# Patient Record
Sex: Female | Born: 1955 | Race: White | Hispanic: No | Marital: Single | Smoking: Former smoker
Health system: Southern US, Community
[De-identification: ages and names within clinical notes are randomized; demographics above are authoritative.]

## PROBLEM LIST (undated history)

## (undated) DIAGNOSIS — B029 Zoster without complications: Secondary | ICD-10-CM

## (undated) DIAGNOSIS — B009 Herpesviral infection, unspecified: Secondary | ICD-10-CM

## (undated) DIAGNOSIS — T7840XA Allergy, unspecified, initial encounter: Secondary | ICD-10-CM

## (undated) DIAGNOSIS — R011 Cardiac murmur, unspecified: Secondary | ICD-10-CM

## (undated) DIAGNOSIS — B019 Varicella without complication: Secondary | ICD-10-CM

## (undated) HISTORY — DX: Herpesviral infection, unspecified: B00.9

## (undated) HISTORY — DX: Varicella without complication: B01.9

## (undated) HISTORY — PX: WISDOM TOOTH EXTRACTION: SHX21

## (undated) HISTORY — DX: Cardiac murmur, unspecified: R01.1

## (undated) HISTORY — DX: Allergy, unspecified, initial encounter: T78.40XA

## (undated) HISTORY — DX: Zoster without complications: B02.9

---

## 2014-09-20 LAB — LIPID PANEL
Cholesterol: 225 — AB (ref 0–200)
HDL: 52 (ref 35–70)
LDL Cholesterol: 146
Triglycerides: 137 (ref 40–160)

## 2014-09-20 LAB — HEPATIC FUNCTION PANEL
ALT: 15 (ref 7–35)
AST: 14 (ref 13–35)

## 2014-09-20 LAB — CBC AND DIFFERENTIAL
HCT: 41 (ref 36–46)
Hemoglobin: 13.1 (ref 12.0–16.0)
Platelets: 254 (ref 150–399)
WBC: 3.7

## 2014-09-20 LAB — TSH: TSH: 2.22 (ref 0.41–5.90)

## 2014-09-20 LAB — HEMOGLOBIN A1C: Hemoglobin A1C: 5.7

## 2014-09-20 LAB — CBC: RBC: 4.38 (ref 3.87–5.11)

## 2015-11-07 LAB — CBC: RBC: 4.31 (ref 3.87–5.11)

## 2015-11-07 LAB — LIPID PANEL
Cholesterol: 256 — AB (ref 0–200)
HDL: 57 (ref 35–70)
LDL Cholesterol: 179
Triglycerides: 102 (ref 40–160)

## 2015-11-07 LAB — CBC AND DIFFERENTIAL
HCT: 40 (ref 36–46)
Hemoglobin: 13.4 (ref 12.0–16.0)
Platelets: 261 (ref 150–399)
WBC: 3.8

## 2015-11-07 LAB — HEPATIC FUNCTION PANEL
ALT: 15 (ref 7–35)
AST: 16 (ref 13–35)

## 2015-11-07 LAB — BASIC METABOLIC PANEL
BUN: 14 (ref 4–21)
Creatinine: 0.8 (ref 0.5–1.1)

## 2015-11-07 LAB — HEMOGLOBIN A1C: Hemoglobin A1C: 5.6

## 2015-11-07 LAB — TSH: TSH: 2.02 (ref 0.41–5.90)

## 2019-11-08 DIAGNOSIS — I2510 Atherosclerotic heart disease of native coronary artery without angina pectoris: Secondary | ICD-10-CM

## 2019-11-08 HISTORY — DX: Atherosclerotic heart disease of native coronary artery without angina pectoris: I25.10

## 2019-11-29 ENCOUNTER — Other Ambulatory Visit: Payer: Self-pay

## 2019-11-29 ENCOUNTER — Ambulatory Visit (INDEPENDENT_AMBULATORY_CARE_PROVIDER_SITE_OTHER): Payer: Medicare Other | Admitting: Family Medicine

## 2019-11-29 ENCOUNTER — Encounter: Payer: Self-pay | Admitting: Family Medicine

## 2019-11-29 VITALS — BP 132/80 | HR 62 | Temp 97.7°F | Ht 65.75 in | Wt 193.0 lb

## 2019-11-29 DIAGNOSIS — Z7689 Persons encountering health services in other specified circumstances: Secondary | ICD-10-CM

## 2019-11-29 DIAGNOSIS — Z1211 Encounter for screening for malignant neoplasm of colon: Secondary | ICD-10-CM

## 2019-11-29 DIAGNOSIS — Z8041 Family history of malignant neoplasm of ovary: Secondary | ICD-10-CM | POA: Insufficient documentation

## 2019-11-29 DIAGNOSIS — H9311 Tinnitus, right ear: Secondary | ICD-10-CM | POA: Diagnosis not present

## 2019-11-29 DIAGNOSIS — E669 Obesity, unspecified: Secondary | ICD-10-CM

## 2019-11-29 DIAGNOSIS — Z8042 Family history of malignant neoplasm of prostate: Secondary | ICD-10-CM

## 2019-11-29 DIAGNOSIS — R030 Elevated blood-pressure reading, without diagnosis of hypertension: Secondary | ICD-10-CM | POA: Insufficient documentation

## 2019-11-29 DIAGNOSIS — H93A2 Pulsatile tinnitus, left ear: Secondary | ICD-10-CM | POA: Insufficient documentation

## 2019-11-29 NOTE — Progress Notes (Signed)
Patient ID: Connie Flores, female  DOB: [birth year], 64 y.o.   MRN: [id number] Patient Care Team    Relationship Specialty Notifications Start End  Ma Hillock, DO PCP - General Family Medicine  11/29/19     Chief Complaint  Patient presents with  . Establish Care    Pt is concerned with hearing heart beat in her ears. Happens off and on. Concerned it may be her BP.      Subjective:  Connie Flores is a 64 y.o.  female present for new patient establishment. All past medical history, surgical history, allergies, family history, immunizations, medications and social history were updated in the electronic medical record today. All recent labs, ED visits and hospitalizations within the last year were reviewed.  Patient reports today to establish care and has complaints of buzzing sound in her right ear.  She feels that it started in the summer 2019.  She saw a doctor at that time and they gave her medications that she does not recall the name and she felt like it improved.  However over the last few months she has noticed a swishing sound in her ear, especially when laying flat at night.  She also endorses intermittently hearing a buzzing sound in her right ear.  She denies any hearing loss or changes.  Depression screen [facility] 2/9 11/29/2019  Decreased Interest 0  Down, Depressed, Hopeless 0  PHQ - 2 Score 0   No flowsheet data found.      No flowsheet data found.  Immunization History  Administered Date(s) Administered  . Influenza,inj,Quad PF,6+ Mos 08/08/2019    No exam data present  Past Medical History:  Diagnosis Date  . Allergy   . Chicken pox   . Heart murmur   . Shingles    No Known Allergies Past Surgical History:  Procedure Laterality Date  . WISDOM TOOTH EXTRACTION     Family History  Problem Relation Age of Onset  . Ovarian cancer Sister   . Miscarriages / Stillbirths Sister   . Diabetes Brother   . Prostate cancer Brother   . Drug abuse Daughter    . Mental illness Daughter   . Prostate cancer Brother   . Prostate cancer Brother    Social History   Social History Narrative   Marital status/children/pets: Divorced.  3 children.  From [place] 2018.   Education/employment: Some college.  Retired   Engineer, materials:      -smoke alarm in the home:Yes     - wears seatbelt: Yes     - Feels safe in their relationships: Yes    Allergies as of 11/29/2019   No Known Allergies     Medication List       Accurate as of November 29, 2019  6:18 PM. If you have any questions, ask your nurse or doctor.        b complex vitamins tablet Take 1 tablet by mouth daily.   Black Elderberry(Berry-Flower) 575 MG Caps Take 1 capsule by mouth daily.   LYSINE PO Take by mouth. PRN for cold sores   Magnesium 250 MG Tabs Take 1 tablet by mouth daily.   Melatonin 2.5 MG Caps Take 2 capsules by mouth at bedtime.       All past medical history, surgical history, allergies, family history, immunizations andmedications were updated in the EMR today and reviewed under the history and medication portions of their EMR.    No results found for this or  any previous visit (from the past 2160 hour(s)).  Patient was never admitted.   ROS: 14 pt review of systems performed and negative (unless mentioned in an HPI)  Objective: BP 132/80 (BP Location: Left Arm, Patient Position: Sitting, Cuff Size: Normal)   Pulse 62   Temp 97.7 F (36.5 C) (Temporal)   Ht 5' 5.75" (1.67 m)   Wt 193 lb (87.5 kg)   SpO2 98%   BMI 31.39 kg/m  Gen: Afebrile. No acute distress. Nontoxic in appearance, well-developed, well-nourished, very pleasant Caucasian female. HENT: AT. [place]. Bilateral TM visualized and normal in appearance, normal external auditory canal. MMM, no oral lesions, adequate dentition. Bilateral nares within normal limits-very mild erythema. Throat without erythema, ulcerations or exudates.  No cough on exam, no hoarseness on exam. Eyes:Pupils Equal Round  Reactive to light, Extraocular movements intact,  Conjunctiva without redness, discharge or icterus. Neck/lymp/endocrine: Supple, no lymphadenopathy CV: RRR 1/6 systolic murmur appreciated Chest: CTAB, no wheeze, rhonchi or crackles.  Normal respiratory effort.  Good air movement.  Assessment/plan: Connie Flores is a 64 y.o. female present for est care Family history of ovarian cancer Patient has 4 siblings with either ovarian cancer or prostate cancer diagnosis all within the last few years.  She has not had a Pap in a couple years.  And she has never been screened for ovarian cancer herself.   Discussed with her today with her family history she should discuss with gynecologist about starting ovarian cancer screenings.  She is agreeable to referral today. We also briefly discussed genetic counseling.  She will speak with her siblings to find out if any of them has had genetic counseling completed. - Ambulatory referral to Gynecology  Colon cancer screening/family history of prostate cancer Patient has never had any type of colon cancer screening.  She is at low risk and without any symptoms.  Discussed Cologuard with her today and we will go ahead and initiate order for her.  Discussed for her to follow-up for her Medicare wellness so that we can address the rest of her preventative measure gaps. - Cologuard  Tinnitus of right ear Exam is normal today.  Very mild symptoms of possible allergies and nose and very mild fluid of ear.   Patient will start Flonase OTC. Referral to ENT for further eval.   Follow-up in 1 to 2 months for Medicare wellness initial.  No orders of the defined types were placed in this encounter.  Orders Placed This Encounter  Procedures  . Cologuard  . Ambulatory referral to Gynecology  . Ambulatory referral to ENT    Referral Orders     Ambulatory referral to Gynecology     Ambulatory referral to ENT  Note is dictated utilizing voice recognition  software. Although note has been proof read prior to signing, occasional typographical errors still can be missed. If any questions arise, please do not hesitate to call for verification.  Electronically signed by: Howard Pouch, DO [place]

## 2019-11-29 NOTE — Patient Instructions (Addendum)
It was a pleasure meeting you today.  Please schedule your medicare wellness visit in 1-2 months (to allow [place] time to get records). Referrals:  Start flonase OTC daily. I have also referred you to ENT for further eval.  Gyn referral placed for fhx of cancers.  cologuard was ordered for you- they will call you to explain process.    COVID-19 Vaccine Information can be found at: [email or web address] For questions related to vaccine distribution or appointments, please email [email or web address] or call [phone].  Please help [place] help you:  We are honored you have chosen [facility] for your Primary Care home. Below you will find basic instructions that you may need to access in the future. Please help [place] help you by reading the instructions, which cover many of the frequent questions we experience.   Prescription refills and request:  -In order to allow more efficient response time, please call your pharmacy for all refills. They will forward the request electronically to [place]. This allows for the quickest possible response. Request left on a nurse line can take longer to refill, since these are checked as time allows between office patients and other phone calls.  - refill request can take up to 3-5 working days to complete.  - If request is sent electronically and request is appropiate, it is usually completed in 1-2 business days.  - all patients will need to be seen routinely for all chronic medical conditions requiring prescription medications (see follow-up below). If you are overdue for follow up on your condition, you will be asked to make an appointment and we will call in enough medication to cover you until your appointment (up to 30 days).  - all controlled substances will require a face to face visit to request/refill.  - if you desire your prescriptions to go through a new pharmacy, and have an active script at original  pharmacy, you will need to call your pharmacy and have scripts transferred to new pharmacy. This is completed between the pharmacy locations and not by your provider.    Results: If any images or labs were ordered, it can take up to 1 week to get results depending on the test ordered and the lab/facility running and resulting the test. - Normal or stable results, which do not need further discussion, may be released to your mychart immediately with attached note to you. A call may not be generated for normal results. Please make certain to sign up for mychart. If you have questions on how to activate your mychart you can call the front office.  - If your results need further discussion, our office will attempt to contact you via phone, and if unable to reach you after 2 attempts, we will release your abnormal result to your mychart with instructions.  - All results will be automatically released in mychart after 1 week.  - Your provider will provide you with explanation and instruction on all relevant material in your results. Please keep in mind, results and labs may appear confusing or abnormal to the untrained eye, but it does not mean they are actually abnormal for you personally. If you have any questions about your results that are not covered, or you desire more detailed explanation than what was provided, you should make an appointment with your provider to do so.   Our office handles many outgoing and incoming calls daily. If we have not contacted you within 1 week about your results, please check your mychart to see  if there is a message first and if not, then contact our office.  In helping with this matter, you help decrease call volume, and therefore allow [place] to be able to respond to patients needs more efficiently.   Acute office visits (sick visit):  An acute visit is intended for a new problem and are scheduled in shorter time slots to allow schedule openings for patients with new  problems. This is the appropriate visit to discuss a new problem. Problems will not be addressed by phone call or Echart message. Appointment is needed if requesting treatment. In order to provide you with excellent quality medical care with proper time for you to explain your problem, have an exam and receive treatment with instructions, these appointments should be limited to one new problem per visit. If you experience a new problem, in which you desire to be addressed, please make an acute office visit, we save openings on the schedule to accommodate you. Please do not save your new problem for any other type of visit, let us take care of it properly and quickly for you.   Follow up visits:  Depending on your condition(s) your provider will need to see you routinely in order to provide you with quality care and prescribe medication(s). Most chronic conditions (Example: hypertension, Diabetes, depression/anxiety... etc), require visits a couple times a year. Your provider will instruct you on proper follow up for your personal medical conditions and history. Please make certain to make follow up appointments for your condition as instructed. Failing to do so could result in lapse in your medication treatment/refills. If you request a refill, and are overdue to be seen on a condition, we will always provide you with a 30 day script (once) to allow you time to schedule.    Medicare wellness (well visit): - we have a wonderful Nurse Maudie Mercury), that will meet with you and provide you will yearly medicare wellness visits. These visits should occur yearly (can not be scheduled less than 1 calendar year apart) and cover preventive health, immunizations, advance directives and screenings you are entitled to yearly through your medicare benefits. Do not miss out on your entitled benefits, this is when medicare will pay for these benefits to be ordered for you.  These are strongly encouraged by your provider and is the  appropriate type of visit to make certain you are up to date with all preventive health benefits. If you have not had your medicare wellness exam in the last 12 months, please make certain to schedule one by calling the office and schedule your medicare wellness with Maudie Mercury as soon as possible.   Yearly physical (well visit):  - Adults are recommended to be seen yearly for physicals. Check with your insurance and date of your last physical, most insurances require one calendar year between physicals. Physicals include all preventive health topics, screenings, medical exam and labs that are appropriate for gender/age and history. You may have fasting labs needed at this visit. This is a well visit (not a sick visit), new problems should not be covered during this visit (see acute visit).  - Pediatric patients are seen more frequently when they are younger. Your provider will advise you on well child visit timing that is appropriate for your their age. - This is not a medicare wellness visit. Medicare wellness exams do not have an exam portion to the visit. Some medicare companies allow for a physical, some do not allow a yearly physical. If your medicare  allows a yearly physical you can schedule the medicare wellness with our nurse Maudie Mercury and have your physical with your provider after, on the same day. Please check with insurance for your full benefits.   Late Policy/No Shows:  - all new patients should arrive 15-30 minutes earlier than appointment to allow [place] time  to  obtain all personal demographics,  insurance information and for you to complete office paperwork. - All established patients should arrive 10-15 minutes earlier than appointment time to update all information and be checked in .  - In our best efforts to run on time, if you are late for your appointment you will be asked to either reschedule or if able, we will work you back into the schedule. There will be a wait time to work you back in the  schedule,  depending on availability.  - If you are unable to make it to your appointment as scheduled, please call 24 hours ahead of time to allow [place] to fill the time slot with someone else who needs to be seen. If you do not cancel your appointment ahead of time, you may be charged a no show fee.

## 2019-12-03 ENCOUNTER — Telehealth: Payer: Self-pay

## 2019-12-03 DIAGNOSIS — Z1211 Encounter for screening for malignant neoplasm of colon: Secondary | ICD-10-CM

## 2019-12-03 NOTE — Telephone Encounter (Signed)
Gastroenterology referral placed.  Please inform patient she will receive a call from that office to get scheduled to discuss.

## 2019-12-03 NOTE — Addendum Note (Signed)
Addended by: Howard Pouch A on: 12/03/2019 04:29 PM   Modules accepted: Orders

## 2019-12-03 NOTE — Telephone Encounter (Signed)
Sent to Dr Raoul Pitch to approve

## 2019-12-03 NOTE — Telephone Encounter (Signed)
Patient has changed her mind. After speaking with a friend she would rather have a colonoscopy rather than the colorguard. This will be her first colonoscopy.

## 2019-12-03 NOTE — Telephone Encounter (Signed)
Pt was called and information was given

## 2019-12-04 ENCOUNTER — Ambulatory Visit (AMBULATORY_SURGERY_CENTER): Payer: Self-pay | Admitting: *Deleted

## 2019-12-04 ENCOUNTER — Other Ambulatory Visit: Payer: Self-pay

## 2019-12-04 VITALS — Temp 96.5°F | Ht 65.75 in | Wt 197.0 lb

## 2019-12-04 DIAGNOSIS — Z01818 Encounter for other preprocedural examination: Secondary | ICD-10-CM

## 2019-12-04 DIAGNOSIS — Z1211 Encounter for screening for malignant neoplasm of colon: Secondary | ICD-10-CM

## 2019-12-04 NOTE — Progress Notes (Signed)
Patient is here in-person for PV. Patient denies any allergies to eggs or soy. Patient denies any problems with anesthesia/sedation. Patient denies any oxygen use at home. Patient denies taking any diet/weight loss medications or blood thinners. Patient is not being treated for MRSA or C-diff. EMMI education assisgned to the patient for the procedure, this was explained and instructions given to patient. COVID-19 screening test is on 2/8, the pt is aware. Pt is aware that care partner will wait in the car during procedure; if they feel like they will be too hot or cold to wait in the car; they may wait in the 4 th floor lobby. Patient is aware to bring only one care partner. We want them to wear a mask (we do not have any that we can provide them), practice social distancing, and we will check their temperatures when they get here.  I did remind the patient that their care partner needs to stay in the parking lot the entire time and have a cell phone available, we will call them when the pt is ready for discharge. Patient will wear mask into building.

## 2019-12-10 ENCOUNTER — Other Ambulatory Visit: Payer: Self-pay

## 2019-12-11 ENCOUNTER — Ambulatory Visit: Payer: Medicare Other | Admitting: Obstetrics and Gynecology

## 2019-12-11 ENCOUNTER — Ambulatory Visit (INDEPENDENT_AMBULATORY_CARE_PROVIDER_SITE_OTHER): Payer: Medicare Other | Admitting: Obstetrics and Gynecology

## 2019-12-11 ENCOUNTER — Encounter: Payer: Self-pay | Admitting: Obstetrics and Gynecology

## 2019-12-11 VITALS — BP 122/78 | Ht 66.0 in | Wt 190.0 lb

## 2019-12-11 DIAGNOSIS — Z124 Encounter for screening for malignant neoplasm of cervix: Secondary | ICD-10-CM

## 2019-12-11 DIAGNOSIS — Z01419 Encounter for gynecological examination (general) (routine) without abnormal findings: Secondary | ICD-10-CM | POA: Diagnosis not present

## 2019-12-11 DIAGNOSIS — Z780 Asymptomatic menopausal state: Secondary | ICD-10-CM

## 2019-12-11 NOTE — Addendum Note (Signed)
Addended by: Nelva Nay on: 12/11/2019 03:25 PM   Modules accepted: Orders

## 2019-12-11 NOTE — Patient Instructions (Signed)
Please schedule an annual screening mammogram and a bone density/DEXA scan when able.  I recommend regular weight bearing exercise, and vitamin D/calcium intake.

## 2019-12-11 NOTE — Progress Notes (Addendum)
   Marquella Ramdeen 1956-04-17 [id number]  SUBJECTIVE:  64 y.o. G3P3 female for annual routine gynecologic exam and Pap smear. She has no gynecologic concerns.  Newer to the area, originally from [place] and decided to come in for a routine exam now that she has insurance.   Current Outpatient Medications  Medication Sig Dispense Refill  . Ascorbic Acid (VITAMIN C PO) Take by mouth.    Marland Kitchen b complex vitamins tablet Take 1 tablet by mouth daily.    . Black Elderberry,Berry-Flower, 575 MG CAPS Take 1 capsule by mouth daily.    Marland Kitchen LYSINE PO Take by mouth. PRN for cold sores    . Magnesium 250 MG TABS Take 1 tablet by mouth daily.    . Melatonin 2.5 MG CAPS Take 2 capsules by mouth at bedtime.     No current facility-administered medications for this visit.   Allergies: Patient has no known allergies.  No LMP recorded. Patient is postmenopausal.  Past medical history,surgical history, problem list, medications, allergies, family history and social history were all reviewed and documented as reviewed in the EPIC chart.  ROS:  Feeling well. No dyspnea or chest pain on exertion.  No abdominal pain, change in bowel habits, black or bloody stools.  No urinary tract symptoms. GYN ROS: no abnormal bleeding, pelvic pain or discharge, no breast pain or new or enlarging lumps on self exam. No neurological complaints.    OBJECTIVE:  BP 122/78   Ht 5\' 6"  (1.676 m)   Wt 190 lb (86.2 kg)   BMI 30.67 kg/m  The patient appears well, alert, oriented x 3, in no distress. ENT normal.  Neck supple. No cervical or supraclavicular adenopathy or thyromegaly.  Lungs are clear, good air entry, no wheezes, rhonchi or rales. S1 and S2 normal, no murmurs, regular rate and rhythm.  Abdomen soft without tenderness, guarding, mass or organomegaly.  Neurological is normal, no focal findings.  BREAST EXAM: breasts appear normal, no suspicious masses, no skin or nipple changes or axillary nodes  PELVIC EXAM: VULVA: normal  appearing vulva with no masses, tenderness or lesions, atrophic changes noted, VAGINA: normal appearing vagina with normal color and discharge, no lesions, CERVIX: normal appearing cervix without discharge or lesions, UTERUS: uterus is normal size, shape, consistency and nontender, ADNEXA: normal adnexa in size, nontender and no masses, RECTAL: normal rectal, no masses  Chaperone: Caryn Bee present during the examination  ASSESSMENT:  64 y.o. G3P3 here for annual gynecologic exam  PLAN:   1. Postmenopausal.  No gynecologic concerns at this time. 2. Pap smear 2014. Pap smear is repeated today. No prior history of abnormal Pap smears. Next  3. Mammogram - not done in a while. Encouraged to schedule this when ready. Breast exam normal today. 4. Colonoscopy scheduled 12/28/2019. 5. DEXA indicated so I recommended that she schedule this and she will plan to do so within the year. 6. Health maintenance.  No lab work as she will plan to have this completed with her primary care provider when she has an appointment this upcoming month.   Return annually or sooner, prn.  Joseph Pierini MD  12/11/19

## 2019-12-12 LAB — PAP IG W/ RFLX HPV ASCU

## 2019-12-12 NOTE — Progress Notes (Signed)
Please let Ms. Weese know her pap smear is normal.

## 2019-12-13 ENCOUNTER — Telehealth: Payer: Self-pay

## 2019-12-13 NOTE — Telephone Encounter (Signed)
Pt called stating she was going to do Cologuard, but has now decided to do a colonoscopy and she has received the Cologuard package and wants to return it so she is not charged anything by them.  She has called two phone numbers:  [phone] & [phone], but only gets recoding.  Pt wants to know if there is another number sha can call to get through to them to return the package?

## 2019-12-13 NOTE — Telephone Encounter (Signed)
The number is [phone]

## 2019-12-13 NOTE — Telephone Encounter (Signed)
Pt was called and given number

## 2019-12-16 ENCOUNTER — Other Ambulatory Visit: Payer: Self-pay

## 2019-12-16 ENCOUNTER — Other Ambulatory Visit: Payer: Self-pay | Admitting: Gastroenterology

## 2019-12-16 ENCOUNTER — Ambulatory Visit (INDEPENDENT_AMBULATORY_CARE_PROVIDER_SITE_OTHER): Payer: Medicare Other

## 2019-12-16 DIAGNOSIS — Z1159 Encounter for screening for other viral diseases: Secondary | ICD-10-CM

## 2019-12-17 LAB — SARS CORONAVIRUS 2 (TAT 6-24 HRS): SARS Coronavirus 2: NEGATIVE

## 2019-12-18 ENCOUNTER — Ambulatory Visit (AMBULATORY_SURGERY_CENTER): Payer: Medicare Other | Admitting: Gastroenterology

## 2019-12-18 ENCOUNTER — Encounter: Payer: Self-pay | Admitting: Gastroenterology

## 2019-12-18 ENCOUNTER — Other Ambulatory Visit: Payer: Self-pay

## 2019-12-18 VITALS — BP 116/69 | HR 62 | Temp 97.1°F | Resp 16 | Ht 65.0 in | Wt 197.0 lb

## 2019-12-18 DIAGNOSIS — Z1211 Encounter for screening for malignant neoplasm of colon: Secondary | ICD-10-CM

## 2019-12-18 MED ORDER — SODIUM CHLORIDE 0.9 % IV SOLN: 500.0000 mL | Freq: Once | INTRAVENOUS | Status: DC

## 2019-12-18 NOTE — Progress Notes (Signed)
PT taken to PACU. Monitors in place. VSS. Report given to RN. 

## 2019-12-18 NOTE — Op Note (Signed)
[place] Patient Name: Connie Flores Procedure Date: 12/18/2019 9:00 AM MRN: [id number] Endoscopist: Thornton Park MD, MD Age: 64 Referring MD:  Date of Birth: 1956-08-03 Gender: Female Account #: [id number] Procedure:                Colonoscopy Indications:              Screening for colorectal malignant neoplasm, This                            is the patient's first colonoscopy                           Brother and sister with colon polyps in their                            50s-early 7s                           No known family history of colon cancer Medicines:                Monitored Anesthesia Care Procedure:                Pre-Anesthesia Assessment:                           - Prior to the procedure, a History and Physical                            was performed, and patient medications and                            allergies were reviewed. The patient's tolerance of                            previous anesthesia was also reviewed. The risks                            and benefits of the procedure and the sedation                            options and risks were discussed with the patient.                            All questions were answered, and informed consent                            was obtained. Prior Anticoagulants: The patient has                            taken no previous anticoagulant or antiplatelet                            agents. ASA Grade Assessment: II - A patient with  mild systemic disease. After reviewing the risks                            and benefits, the patient was deemed in                            satisfactory condition to undergo the procedure.                           After obtaining informed consent, the colonoscope                            was passed under direct vision. Throughout the                            procedure, the patient's blood pressure, pulse, and     oxygen saturations were monitored continuously. The                            Colonoscope was introduced through the anus and                            advanced to the 3 cm into the ileum. A second                            forward view of the right colon was performed. The                            colonoscopy was performed with difficulty due to a                            redundant colon and significant looping. Successful                            completion of the procedure was aided by changing                            the patient to a supine position and applying                            abdominal pressure. The patient tolerated the                            procedure well. The quality of the bowel                            preparation was good. The terminal ileum, ileocecal                            valve, appendiceal orifice, and rectum were                            photographed. Scope In: 9:09:54 AM Scope Out: 9:25:33 AM Scope  Withdrawal Time: 0 hours 6 minutes 51 seconds  Total Procedure Duration: 0 hours 15 minutes 39 seconds  Findings:                 Hemorrhoids were found on perianal exam.                           A diffuse area of mildly melanotic mucosa was found                            in the entire colon.                           The colon (entire examined portion) appeared normal.                           The terminal ileum appeared normal.                           The exam was otherwise without abnormality on                            direct and retroflexion views. Complications:            No immediate complications. Estimated Blood Loss:     Estimated blood loss: none. Impression:               - Hemorrhoids found on perianal exam.                           - Melanotic mucosa in the entire examined colon.                           - The entire examined colon is normal.                           - The examined portion of the ileum was  normal. Recommendation:           - Patient has a contact number available for                            emergencies. The signs and symptoms of potential                            delayed complications were discussed with the                            patient. Return to normal activities tomorrow.                            Written discharge instructions were provided to the                            patient.                           - Resume previous diet.                           -  Continue present medications.                           - Repeat colonoscopy in 5 years for screening                            purposes given the family history of polyps. Thornton Park MD, MD 12/18/2019 9:33:31 AM This report has been signed electronically.

## 2019-12-18 NOTE — Patient Instructions (Signed)
Handout on hemorrhoids given to you today   YOU HAD AN ENDOSCOPIC PROCEDURE TODAY AT THE [facility]:   Refer to the procedure report that was given to you for any specific questions about what was found during the examination.  If the procedure report does not answer your questions, please call your gastroenterologist to clarify.  If you requested that your care partner not be given the details of your procedure findings, then the procedure report has been included in a sealed envelope for you to review at your convenience later.  YOU SHOULD EXPECT: Some feelings of bloating in the abdomen. Passage of more gas than usual.  Walking can help get rid of the air that was put into your GI tract during the procedure and reduce the bloating. If you had a lower endoscopy (such as a colonoscopy or flexible sigmoidoscopy) you may notice spotting of blood in your stool or on the toilet paper. If you underwent a bowel prep for your procedure, you may not have a normal bowel movement for a few days.  Please Note:  You might notice some irritation and congestion in your nose or some drainage.  This is from the oxygen used during your procedure.  There is no need for concern and it should clear up in a day or so.  SYMPTOMS TO REPORT IMMEDIATELY:   Following lower endoscopy (colonoscopy or flexible sigmoidoscopy):  Excessive amounts of blood in the stool  Significant tenderness or worsening of abdominal pains  Swelling of the abdomen that is new, acute  Fever of 100F or higher  For urgent or emergent issues, a gastroenterologist can be reached at any hour by calling [phone].   DIET:  We do recommend a small meal at first, but then you may proceed to your regular diet.  Drink plenty of fluids but you should avoid alcoholic beverages for 24 hours.  ACTIVITY:  You should plan to take it easy for the rest of today and you should NOT DRIVE or use heavy machinery until tomorrow (because of the  sedation medicines used during the test).    FOLLOW UP: Our staff will call the number listed on your records 48-72 hours following your procedure to check on you and address any questions or concerns that you may have regarding the information given to you following your procedure. If we do not reach you, we will leave a message.  We will attempt to reach you two times.  During this call, we will ask if you have developed any symptoms of COVID 19. If you develop any symptoms (ie: fever, flu-like symptoms, shortness of breath, cough etc.) before then, please call [phone].  If you test positive for Covid 19 in the 2 weeks post procedure, please call and report this information to us.    If any biopsies were taken you will be contacted by phone or by letter within the next 1-3 weeks.  Please call us at [phone] if you have not heard about the biopsies in 3 weeks.    SIGNATURES/CONFIDENTIALITY: You and/or your care partner have signed paperwork which will be entered into your electronic medical record.  These signatures attest to the fact that that the information above on your After Visit Summary has been reviewed and is understood.  Full responsibility of the confidentiality of this discharge information lies with you and/or your care-partner. 

## 2019-12-20 ENCOUNTER — Telehealth: Payer: Self-pay

## 2019-12-20 ENCOUNTER — Telehealth: Payer: Self-pay | Admitting: *Deleted

## 2019-12-20 NOTE — Telephone Encounter (Signed)
Follow up call made, no answer, left message 

## 2019-12-20 NOTE — Telephone Encounter (Signed)
  Follow up Call-  Call back number 12/18/2019  Post procedure Call Back phone  # [phone]  Permission to leave phone message Yes     Patient questions:  Do you have a fever, pain , or abdominal swelling? No. Pain Score  0 *  Have you tolerated food without any problems? Yes.    Have you been able to return to your normal activities? Yes.    Do you have any questions about your discharge instructions: Diet   No. Medications  No. Follow up visit  No.  Do you have questions or concerns about your Care? No.  Actions: * If pain score is 4 or above: No action needed, pain <4.  1. Have you developed a fever since your procedure? no  2.   Have you had an respiratory symptoms (SOB or cough) since your procedure? no  3.   Have you tested positive for COVID 19 since your procedure no  4.   Have you had any family members/close contacts diagnosed with the COVID 19 since your procedure?  no   If yes to any of these questions please route to Joylene John, RN and Alphonsa Gin, Therapist, sports.

## 2020-01-09 ENCOUNTER — Encounter: Payer: Self-pay | Admitting: Family Medicine

## 2020-01-10 ENCOUNTER — Telehealth (HOSPITAL_COMMUNITY): Payer: Self-pay | Admitting: *Deleted

## 2020-01-10 NOTE — Telephone Encounter (Signed)
l msg asking for pt to call back to schedule.

## 2020-01-15 ENCOUNTER — Other Ambulatory Visit (HOSPITAL_COMMUNITY): Payer: Self-pay | Admitting: Otolaryngology

## 2020-01-15 ENCOUNTER — Telehealth (HOSPITAL_COMMUNITY): Payer: Self-pay

## 2020-01-15 DIAGNOSIS — R0989 Other specified symptoms and signs involving the circulatory and respiratory systems: Secondary | ICD-10-CM

## 2020-01-15 NOTE — Telephone Encounter (Signed)

## 2020-01-16 ENCOUNTER — Ambulatory Visit (HOSPITAL_COMMUNITY)
Admission: RE | Admit: 2020-01-16 | Discharge: 2020-01-16 | Disposition: A | Discharge status: Home / Self Care | Payer: Medicare Other | Source: Ambulatory Visit | Attending: Vascular Surgery | Admitting: Vascular Surgery

## 2020-01-16 DIAGNOSIS — R0989 Other specified symptoms and signs involving the circulatory and respiratory systems: Secondary | ICD-10-CM | POA: Diagnosis present

## 2020-01-23 ENCOUNTER — Encounter: Payer: Self-pay | Admitting: Family Medicine

## 2020-01-23 DIAGNOSIS — E785 Hyperlipidemia, unspecified: Secondary | ICD-10-CM | POA: Insufficient documentation

## 2020-01-27 ENCOUNTER — Other Ambulatory Visit: Payer: Self-pay

## 2020-01-28 ENCOUNTER — Ambulatory Visit (INDEPENDENT_AMBULATORY_CARE_PROVIDER_SITE_OTHER): Payer: Medicare Other

## 2020-01-28 ENCOUNTER — Other Ambulatory Visit: Payer: Self-pay | Admitting: Obstetrics and Gynecology

## 2020-01-28 DIAGNOSIS — Z780 Asymptomatic menopausal state: Secondary | ICD-10-CM

## 2020-01-28 DIAGNOSIS — Z01419 Encounter for gynecological examination (general) (routine) without abnormal findings: Secondary | ICD-10-CM

## 2020-01-31 ENCOUNTER — Other Ambulatory Visit: Payer: Self-pay

## 2020-01-31 ENCOUNTER — Encounter: Payer: Self-pay | Admitting: Family Medicine

## 2020-01-31 ENCOUNTER — Ambulatory Visit (INDEPENDENT_AMBULATORY_CARE_PROVIDER_SITE_OTHER): Payer: Medicare Other | Admitting: Family Medicine

## 2020-01-31 VITALS — BP 133/83 | HR 61 | Temp 97.9°F | Resp 18 | Ht 67.75 in | Wt 185.4 lb

## 2020-01-31 DIAGNOSIS — E663 Overweight: Secondary | ICD-10-CM | POA: Diagnosis not present

## 2020-01-31 DIAGNOSIS — E782 Mixed hyperlipidemia: Secondary | ICD-10-CM

## 2020-01-31 DIAGNOSIS — R0989 Other specified symptoms and signs involving the circulatory and respiratory systems: Secondary | ICD-10-CM

## 2020-01-31 DIAGNOSIS — Z131 Encounter for screening for diabetes mellitus: Secondary | ICD-10-CM

## 2020-01-31 DIAGNOSIS — Z0000 Encounter for general adult medical examination without abnormal findings: Secondary | ICD-10-CM

## 2020-01-31 DIAGNOSIS — Z1159 Encounter for screening for other viral diseases: Secondary | ICD-10-CM | POA: Diagnosis not present

## 2020-01-31 DIAGNOSIS — H93A2 Pulsatile tinnitus, left ear: Secondary | ICD-10-CM | POA: Diagnosis not present

## 2020-01-31 DIAGNOSIS — I6522 Occlusion and stenosis of left carotid artery: Secondary | ICD-10-CM

## 2020-01-31 LAB — CBC
HCT: 38.3 % (ref 36.0–46.0)
Hemoglobin: 12.9 g/dL (ref 12.0–15.0)
MCHC: 33.7 g/dL (ref 30.0–36.0)
MCV: 92 fl (ref 78.0–100.0)
Platelets: 240 10*3/uL (ref 150.0–400.0)
RBC: 4.16 Mil/uL (ref 3.87–5.11)
RDW: 14 % (ref 11.5–15.5)
WBC: 3.4 10*3/uL — ABNORMAL LOW (ref 4.0–10.5)

## 2020-01-31 LAB — COMPREHENSIVE METABOLIC PANEL
ALT: 13 U/L (ref 0–35)
AST: 16 U/L (ref 0–37)
Albumin: 4.7 g/dL (ref 3.5–5.2)
Alkaline Phosphatase: 52 U/L (ref 39–117)
BUN: 13 mg/dL (ref 6–23)
CO2: 28 mEq/L (ref 19–32)
Calcium: 9.8 mg/dL (ref 8.4–10.5)
Chloride: 103 mEq/L (ref 96–112)
Creatinine, Ser: 0.73 mg/dL (ref 0.40–1.20)
GFR: 79.74 mL/min (ref >60.00)
Glucose, Bld: 101 mg/dL — ABNORMAL HIGH (ref 70–99)
Potassium: 4.2 mEq/L (ref 3.5–5.1)
Sodium: 138 mEq/L (ref 135–145)
Total Bilirubin: 0.6 mg/dL (ref 0.2–1.2)
Total Protein: 7 g/dL (ref 6.0–8.3)

## 2020-01-31 LAB — LIPID PANEL
Cholesterol: 238 mg/dL — ABNORMAL HIGH (ref 0–200)
HDL: 56.5 mg/dL (ref >39.00)
LDL Cholesterol: 162 mg/dL — ABNORMAL HIGH (ref 0–99)
NonHDL: 181.55
Total CHOL/HDL Ratio: 4
Triglycerides: 96 mg/dL (ref 0.0–149.0)
VLDL: 19.2 mg/dL (ref 0.0–40.0)

## 2020-01-31 LAB — TSH: TSH: 1.86 u[IU]/mL (ref 0.35–4.50)

## 2020-01-31 NOTE — Progress Notes (Signed)
Medicare AWV, subsequent.  Chief Complaint  Patient presents with  . Medicare Wellness    Fasting. Pap smear 12/11/2019. Mammogram- when pt was 64 yrs old.     Patient Care Team    Relationship Specialty Notifications Start End  Ma Hillock, DO PCP - General Family Medicine  11/29/19   Joseph Pierini, MD Consulting Physician Obstetrics and Gynecology  01/23/20   Melida Quitter, MD Consulting Physician Otolaryngology  01/23/20      History of Present Ilness: Connie Flores, 64 y.o. , female presents today for Medicare wellness visit.  Carotid bruit/hyperlipidemia: Recent Doppler studies showed a left carotid stenosis 40-59%.  Right carotid 1-39%.  Evaluation was completed secondary to pulsatile tinnitus of her left ear.  Patient reports she knows she has higher cholesterol but has declined statins in the past.  Past medical, surgical, family and social histories reviewed (including experiences with illnesses, hospital stays, operations, injuries, and treatments):  Past Medical History:  Diagnosis Date  . Allergy   . Chicken pox   . Heart murmur   . Shingles    All allergies reviewed No Known Allergies Past Surgical History:  Procedure Laterality Date  . WISDOM TOOTH EXTRACTION     Family History  Problem Relation Age of Onset  . Ovarian cancer Sister   . Miscarriages / Stillbirths Sister   . Colon polyps Sister   . Diabetes Brother   . Prostate cancer Brother   . Drug abuse Daughter   . Mental illness Daughter   . Prostate cancer Brother   . Colon polyps Brother   . Prostate cancer Brother   . Colon cancer Neg Hx   . Esophageal cancer Neg Hx   . Rectal cancer Neg Hx   . Stomach cancer Neg Hx    Social History   Social History Narrative   Marital status/children/pets: Divorced.  3 children.  From [place] 2018.   Education/employment: Some college.  Retired   Engineer, materials:      -smoke alarm in the home:Yes     - wears seatbelt: Yes     - Feels safe in their  relationships: Yes    All medications verified Allergies as of 01/31/2020   No Known Allergies     Medication List       Accurate as of January 31, 2020  9:09 AM. If you have any questions, ask your nurse or doctor.        b complex vitamins tablet Take 1 tablet by mouth daily.   Black Elderberry(Berry-Flower) 575 MG Caps Take 1 capsule by mouth daily.   CoQ10 100 MG Caps Take by mouth. Daily   LYSINE PO Take by mouth. PRN for cold sores   Magnesium 250 MG Tabs Take 1 tablet by mouth daily.   Melatonin 2.5 MG Caps Take 2 capsules by mouth at bedtime.   VITAMIN C PO Take by mouth.       Health maintenance:  Colonoscopy: completed 12/18/2019- 5 yr f/u fhx. Mammogram (50-74): established with GYN> they told her to schedule when ready at her 2021 appt  Cervical cancer screening(<65): completed 2021 at GYN Immunizations: tdap 08/2017. shingrix and PNA series declined today.  She will think about the Shingrix and call and if desired.  Influenza UTD 2020. Ranelle Oyster Covid vaccine completed 01/20/2020.  Infectious disease screening: hep C screen screening agreeable today DEXA; scheduled for 01/28/2020  Glaucoma screen: low risk Flores: Whisper test, no barriers identified.   Depression screen  [facility] 2/9 01/31/2020 11/29/2019  Decreased Interest 0 0  Down, Depressed, Hopeless 0 0  PHQ - 2 Score 0 0   No flowsheet data found.  Cognitive assessment:  Word recall (daisy, blue, church): 3/3 Immunizations: Immunization History  Administered Date(s) Administered  . Influenza,inj,Quad PF,6+ Mos 08/08/2019  . Janssen (J&J) SARS-COV-2 Vaccination 01/20/2020    Exercise: Current Exercise Habits: The patient does not participate in regular exercise at present Exercise limited by: orthopedic condition(s)  Diet: Regular  Functional Status Survey: Get up and go test: steady and less than 20 seconds.  Is the patient deaf or have difficulty Flores?: No Does the patient have  difficulty seeing, even when wearing glasses/contacts?: No Does the patient have difficulty concentrating, remembering, or making decisions?: No Does the patient have difficulty walking or climbing stairs?: No Does the patient have difficulty dressing or bathing?: No Does the patient have difficulty doing errands alone such as visiting a doctor's office or shopping?: No Current Exercise Habits: The patient does not participate in regular exercise at present Exercise limited by: orthopedic condition(s) Fall Risk  01/31/2020  Falls in the past year? 0  Follow up Falls evaluation completed;Education provided;Falls prevention discussed    Advanced Care Planning: N> packet provided.   Cardiovascular Screening Blood Tests Lipid Panel     Component Value Date/Time   CHOL 256 (A) 11/07/2015 0000   TRIG 102 11/07/2015 0000   HDL 57 11/07/2015 0000   LDLCALC 179 11/07/2015 0000    Diabetes Screening Tests Lab Results  Component Value Date   HGBA1C 5.6 11/07/2015   BP 133/83 (BP Location: Left Arm, Patient Position: Sitting, Cuff Size: Normal)   Pulse 61   Temp 97.9 F (36.6 C) (Temporal)   Resp 18   Ht 5' 7.75" (1.721 m)   Wt 185 lb 6 oz (84.1 kg)   SpO2 97%   BMI 28.39 kg/m  Gen: Afebrile. No acute distress.  HENT: AT. [place].  Eyes:Pupils Equal Round Reactive to light, Extraocular movements intact,  Conjunctiva without redness, discharge or icterus. Neck/lymp/endocrine: Supple, no lymphadenopathy, no thyromegaly CV: RRR no murmur, no edema, soft left carotid bruit present Chest: CTAB, no wheeze or crackles  Neuro:  Normal gait. PERLA. EOMi. Alert. Oriented. Psych: Normal affect, dress and demeanor. Normal speech. Normal thought content and judgment.  Assessment and Plan: Overweight (BMI 25.0-29.9)/Mixed hyperlipidemia/carotid artery stenosis/bruit-left -Discussed carotid artery stenosis, elevated cholesterol and cardiovascular risk. -Carotid artery stenosis asymptomatic other  than left tinnitus at this time.  Left stenosis 40 to 59%.  Continue to monitor for symptoms, repeat carotid Dopplers every 1-2 years or if symptomatic. -Diet and exercise modifications discussed. -Patient prefers to not start a statin - Comprehensive metabolic panel - Lipid panel - TSH - CBC  Pulsatile tinnitus of left ear Felt secondary to carotid artery stenosis.  Need for hepatitis C screening test - Hepatitis C Antibody  Medicare annual wellness visit, subsequent Colonoscopy: completed 12/18/2019- 5 yr f/u fhx. Mammogram (50-74): established with GYN> they told her to schedule when ready at her 2021 appt  Cervical cancer screening(<65): completed 2021 at GYN Immunizations: tdap 08/2017. shingrix and PNA series declined today.  She will think about the Shingrix and call and if desired.  Influenza UTD 2020. Ranelle Oyster Covid vaccine completed 01/20/2020.  Infectious disease screening: hep C screen screening agreeable today DEXA; scheduled for 01/28/2020    Orders Placed This Encounter  Procedures  . Comprehensive metabolic panel  . Lipid panel  . TSH  .  CBC  . Hepatitis C Antibody   No orders of the defined types were placed in this encounter.  Referral Orders  No referral(s) requested today     Electronically Signed by: Howard Pouch, DO [place]

## 2020-01-31 NOTE — Patient Instructions (Addendum)
Health Maintenance After Age 66 After age 70, you are at a higher risk for certain long-term diseases and infections as well as injuries from falls. Falls are a major cause of broken bones and head injuries in people who are older than age 59. Getting regular preventive care can help to keep you healthy and well. Preventive care includes getting regular testing and making lifestyle changes as recommended by your health care provider. Talk with your health care provider about:  Which screenings and tests you should have. A screening is a test that checks for a disease when you have no symptoms.  A diet and exercise plan that is right for you. What should I know about screenings and tests to prevent falls? Screening and testing are the best ways to find a health problem early. Early diagnosis and treatment give you the best chance of managing medical conditions that are common after age 59. Certain conditions and lifestyle choices may make you more likely to have a fall. Your health care provider may recommend:  Regular vision checks. Poor vision and conditions such as cataracts can make you more likely to have a fall. If you wear glasses, make sure to get your prescription updated if your vision changes.  Medicine review. Work with your health care provider to regularly review all of the medicines you are taking, including over-the-counter medicines. Ask your health care provider about any side effects that may make you more likely to have a fall. Tell your health care provider if any medicines that you take make you feel dizzy or sleepy.  Osteoporosis screening. Osteoporosis is a condition that causes the bones to get weaker. This can make the bones weak and cause them to break more easily.  Blood pressure screening. Blood pressure changes and medicines to control blood pressure can make you feel dizzy.  Strength and balance checks. Your health care provider may recommend certain tests to  check your strength and balance while standing, walking, or changing positions.  Foot health exam. Foot pain and numbness, as well as not wearing proper footwear, can make you more likely to have a fall.  Depression screening. You may be more likely to have a fall if you have a fear of falling, feel emotionally low, or feel unable to do activities that you used to do.  Alcohol use screening. Using too much alcohol can affect your balance and may make you more likely to have a fall. What actions can I take to lower my risk of falls? General instructions  Talk with your health care provider about your risks for falling. Tell your health care provider if: ? You fall. Be sure to tell your health care provider about all falls, even ones that seem minor. ? You feel dizzy, sleepy, or off-balance.  Take over-the-counter and prescription medicines only as told by your health care provider. These include any supplements.  Eat a healthy diet and maintain a healthy weight. A healthy diet includes low-fat dairy products, low-fat (lean) meats, and fiber from whole grains, beans, and lots of fruits and vegetables. Home safety  Remove any tripping hazards, such as rugs, cords, and clutter.  Install safety equipment such as grab bars in bathrooms and safety rails on stairs.  Keep rooms and walkways well-lit. Activity   Follow a regular exercise program to stay fit. This will help you maintain your balance. Ask your health care provider what types of exercise are appropriate for you.  If you need a  cane or walker, use it as recommended by your health care provider.  Wear supportive shoes that have nonskid soles. Lifestyle  Do not drink alcohol if your health care provider tells you not to drink.  If you drink alcohol, limit how much you have: ? 0-1 drink a day for women. ? 0-2 drinks a day for men.  Be aware of how much alcohol is in your drink. In the [place] one drink equals one typical bottle  of beer (12 oz), one-half glass of wine (5 oz), or one shot of hard liquor (1 oz).  Do not use any products that contain nicotine or tobacco, such as cigarettes and e-cigarettes. If you need help quitting, ask your health care provider. Summary  Having a healthy lifestyle and getting preventive care can help to protect your health and wellness after age 98.  Screening and testing are the best way to find a health problem early and help you avoid having a fall. Early diagnosis and treatment give you the best chance for managing medical conditions that are more common for people who are older than age 43.  Falls are a major cause of broken bones and head injuries in people who are older than age 75. Take precautions to prevent a fall at home.  Work with your health care provider to learn what changes you can make to improve your health and wellness and to prevent falls. This information is not intended to replace advice given to you by your health care provider. Make sure you discuss any questions you have with your health care provider. Document Revised: 02/14/2019 Document Reviewed: 09/06/2017 Elsevier Patient Education  [place]. Carotid Artery Disease  Carotid artery disease is the narrowing or blockage of one or both carotid arteries. This condition is also called carotid artery stenosis. The carotid arteries are the two main blood vessels on either side of the neck. They send blood to the brain, other parts of the head, and the neck.  This condition increases your risk for a stroke or a transient ischemic attack (TIA). A TIA is a "mini-stroke" that causes stroke-like symptoms that go away quickly. What are the causes? This condition is mainly caused by a narrowing and hardening of the carotid arteries. The carotid arteries can become narrow or clogged with a buildup of plaque. Plaque includes:  Fat.  Cholesterol.  Calcium.  Other substances. What increases the risk? The  following factors may make you more likely to develop this condition:  Having certain medical conditions, such as: ? High cholesterol. ? High blood pressure. ? Diabetes. ? Obesity.  Smoking.  A family history of cardiovascular disease.  Not being active or lack of regular exercise.  Being female. Men have a higher risk of having arteries become narrow and harden earlier in life than women.  Old age. What are the signs or symptoms? This condition may not have any signs or symptoms until a stroke or TIA happens. In some cases, your doctor may be able to hear a whooshing sound. This can suggest a change in blood flow caused by plaque buildup. An eye exam can also help find signs of the condition. How is this treated? This condition may be treated with more than one treatment. Treatment options include:  Lifestyle changes, such as: ? Quitting smoking. ? Getting regular exercise, or getting exercise as told by your doctor. ? Eating a healthy diet. ? Managing stress. ? Keeping a healthy weight.  Medicines to control: ? Blood  pressure. ? Cholesterol. ? Blood clotting.  Surgery. You may have: ? A surgery to remove the blockages in the carotid arteries. ? A procedure in which a small mesh tube (stent) is used to widen the blocked carotid arteries. Follow these instructions at home: Eating and drinking Follow instructions about your diet from your doctor. It is important to follow a healthy diet.  Eat a diet that includes: ? A lot of fresh fruits and vegetables. ? Low-fat (lean) meats.  Avoid these foods: ? Foods that are high in fat. ? Foods that are high in salt (sodium). ? Foods that are fried. ? Foods that are processed. ? Foods that have few good nutrients (poor nutritional value).  Lifestyle   Keep a healthy weight.  Do exercises as told by your doctor to stay active. Each week, you should get one of the following: ? At least 150 minutes of exercise that raises  your heart rate and makes you sweat (moderate-intensity exercise). ? At least 75 minutes of exercise that takes a lot of effort.  Do not use any products that contain nicotine or tobacco, such as cigarettes, e-cigarettes, and chewing tobacco. If you need help quitting, ask your doctor.  Do not drink alcohol if: ? Your doctor tells you not to drink. ? You are pregnant, may be pregnant, or are planning to become pregnant.  If you drink alcohol: ? Limit how much you use to:  0-1 drink a day for women.  0-2 drinks a day for men. ? Be aware of how much alcohol is in your drink. In the [place] one drink equals one 12 oz bottle of beer (355 mL), one 5 oz glass of wine (148 mL), or one 1 oz glass of hard liquor (44 mL).  Do not use drugs.  Manage your stress. Ask your doctor for tips on how to do this. General instructions  Take over-the-counter and prescription medicines only as told by your doctor.  Keep all follow-up visits as told by your doctor. This is important. Where to find more information  American Heart Association: www.heart.org Get help right away if:  You have any signs of a stroke. "BE FAST" is an easy way to remember the main warning signs: ? B - Balance. Signs are dizziness, sudden trouble walking, or loss of balance. ? E - Eyes. Signs are trouble seeing or a change in how you see. ? F - Face. Signs are sudden weakness or loss of feeling of the face, or the face or eyelid drooping on one side. ? A - Arms. Signs are weakness or loss of feeling in an arm. This happens suddenly and usually on one side of the body. ? S - Speech. Signs are sudden trouble speaking, slurred speech, or trouble understanding what people say. ? T - Time. Time to call emergency services. Write down what time symptoms started.  You have other signs of a stroke, such as: ? A sudden, very bad headache with no known cause. ? Feeling like you may vomit (nausea). ? Vomiting. ? A seizure. These  symptoms may be an emergency. Do not wait to see if the symptoms will go away. Get medical help right away. Call your local emergency services (911 in the [place] Do not drive yourself to the hospital. Summary  The carotid arteries are blood vessels on both sides of the neck.  If these arteries get smaller or get blocked, you are more likely to have a stroke or a mini-stroke.  This condition can be treated with lifestyle changes, medicines, surgery, or a blend of these treatments.  Get help right away if you have any signs of a stroke. "BE FAST" is an easy way to remember the main warning signs of stroke. This information is not intended to replace advice given to you by your health care provider. Make sure you discuss any questions you have with your health care provider. Document Revised: 05/20/2019 Document Reviewed: 05/20/2019 Elsevier Patient Education  [place].

## 2020-02-03 ENCOUNTER — Telehealth: Payer: Self-pay | Admitting: Family Medicine

## 2020-02-03 DIAGNOSIS — R0989 Other specified symptoms and signs involving the circulatory and respiratory systems: Secondary | ICD-10-CM | POA: Insufficient documentation

## 2020-02-03 DIAGNOSIS — Z5320 Procedure and treatment not carried out because of patient's decision for unspecified reasons: Secondary | ICD-10-CM | POA: Insufficient documentation

## 2020-02-03 DIAGNOSIS — I6522 Occlusion and stenosis of left carotid artery: Secondary | ICD-10-CM | POA: Insufficient documentation

## 2020-02-03 LAB — HEPATITIS C ANTIBODY
Hepatitis C Ab: NONREACTIVE
SIGNAL TO CUT-OFF: 0.01 (ref <1.00)

## 2020-02-03 NOTE — Telephone Encounter (Signed)
Follow-up in 6 months to recheck on cholesterol with her diet and exercise changes since she declined medication at this time.  She can also try over-the-counter red yeast rice which has some anticholesterol properties and is more of a natural supplement.

## 2020-02-03 NOTE — Telephone Encounter (Signed)
Please call patient: Her liver, kidney and thyroid are all functioning normal. Electrolytes and blood cell counts are in normal range. Her hepatitis C screening is negative. Her cholesterol panel is elevated. Above goal is her total cholesterol which is 238-goal is less than 200.  LDL 162, goal.  As we discussed in her office visit with her carotid bruit and her now known elevated cholesterol this does put her at higher cardiovascular risk for heart attack or stroke.  A statin medication is recommended by the American Heart Association.  She has expressed disinterest in starting a statin medication. -Options for her are:    -Start a statin    -Start a medication called Zetia which will help lower cholesterol but does not provided the added cardiovascular protection a statin medication will    -Could consider an injectable medication that is self injected every 2 weeks to help lower cholesterol.  Of course increasing exercise and following a low saturated fat diet such as the Mediterranean diet will be helpful as well and encouraged.  Please advise of her decision

## 2020-02-03 NOTE — Telephone Encounter (Signed)
Pt was called and given results. She does not want to start any medications at this time. She will continue to exercise and eat healthy. She will get Lipid panel rechecked whenever Dr Raoul Pitch feels she needs them checked.

## 2020-02-04 NOTE — Telephone Encounter (Signed)
Pt was called, given information and scheduled for F/U appt

## 2020-05-22 ENCOUNTER — Other Ambulatory Visit: Payer: Self-pay

## 2020-05-22 ENCOUNTER — Encounter: Payer: Self-pay | Admitting: Family Medicine

## 2020-05-22 ENCOUNTER — Ambulatory Visit (INDEPENDENT_AMBULATORY_CARE_PROVIDER_SITE_OTHER): Payer: Medicare Other | Admitting: Family Medicine

## 2020-05-22 VITALS — Temp 97.9°F | Resp 18 | Ht 68.0 in | Wt 176.5 lb

## 2020-05-22 DIAGNOSIS — R0989 Other specified symptoms and signs involving the circulatory and respiratory systems: Secondary | ICD-10-CM | POA: Diagnosis not present

## 2020-05-22 DIAGNOSIS — I6522 Occlusion and stenosis of left carotid artery: Secondary | ICD-10-CM | POA: Diagnosis not present

## 2020-05-22 DIAGNOSIS — Z5320 Procedure and treatment not carried out because of patient's decision for unspecified reasons: Secondary | ICD-10-CM

## 2020-05-22 DIAGNOSIS — R42 Dizziness and giddiness: Secondary | ICD-10-CM

## 2020-05-22 NOTE — Progress Notes (Signed)
This visit occurred during the SARS-CoV-2 public health emergency.  Safety protocols were in place, including screening questions prior to the visit, additional usage of staff PPE, and extensive cleaning of exam room while observing appropriate contact time as indicated for disinfecting solutions.    Connie Flores , [birth year], 64 y.o., female MRN: [id number] Patient Care Team    Relationship Specialty Notifications Start End  Ma Hillock, DO PCP - General Family Medicine  11/29/19   Joseph Pierini, MD Consulting Physician Obstetrics and Gynecology  01/23/20   Melida Quitter, MD Consulting Physician Otolaryngology  01/23/20   Thornton Park, MD Consulting Physician Gastroenterology  01/31/20     Chief Complaint  Patient presents with  . Dizziness    Pt has been getting dizzy and feels like head is very heavy. Has happened a few times when laying and bending down     Subjective: Pt presents for an OV with complaints of vertigo of worsening over the last few weeks duration.  Associated symptoms include room spinning and feelings of a "heavy head ".  Patient has a history of vertigo in the past and has seen ENT for condition.  She states over the last couple weeks she noticed her vertigo has become worse especially when laying down, looking over her right shoulder or bending over.  She is drinking water to stay hydrated.  No discernible cause has been identified for her vertigo.  In the past she had been told to use Flonase nasal spray, she admits today she has never started the Flonase nasal spray.  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  Carotid Dopplers 01/2020 resulted with stenosis of right carotid 1-39% and left 40-50%.  Carotid Doppler does mention "torturous "features of bilateral carotid arteries. Patient denies any recent illness, hearing loss, falls or syncope.  Labs 01/31/2020 indicate hyperlipidemia with a total cholesterol 238, HDL 56, LDL 162, triglycerides  96.  Patient declined all prescribed cholesterol medicine.  Depression screen [facility] 2/9 01/31/2020 11/29/2019  Decreased Interest 0 0  Down, Depressed, Hopeless 0 0  PHQ - 2 Score 0 0    No Known Allergies Social History   Social History Narrative   Marital status/children/pets: Divorced.  3 children.  From [place] 2018.   Education/employment: Some college.  Retired   Engineer, materials:      -smoke alarm in the home:Yes     - wears seatbelt: Yes     - Feels safe in their relationships: Yes   Past Medical History:  Diagnosis Date  . Allergy   . Chicken pox   . Heart murmur   . Shingles    Past Surgical History:  Procedure Laterality Date  . WISDOM TOOTH EXTRACTION     Family History  Problem Relation Age of Onset  . Ovarian cancer Sister   . Miscarriages / Stillbirths Sister   . Colon polyps Sister   . Diabetes Brother   . Prostate cancer Brother   . Drug abuse Daughter   . Mental illness Daughter   . Prostate cancer Brother   . Colon polyps Brother   . Prostate cancer Brother   . Colon cancer Neg Hx   . Esophageal cancer Neg Hx   . Rectal cancer Neg Hx   . Stomach cancer Neg Hx    Allergies as of 05/22/2020   No Known Allergies     Medication List       Accurate as of May 22, 2020 11:59  PM. If you have any questions, ask your nurse or doctor.        b complex vitamins tablet Take 1 tablet by mouth daily.   Black Elderberry(Berry-Flower) 575 MG Caps Take 1 capsule by mouth daily.   CoQ10 100 MG Caps Take by mouth. Daily   LYSINE PO Take by mouth. PRN for cold sores   Magnesium 250 MG Tabs Take 1 tablet by mouth daily.   Melatonin 2.5 MG Caps Take 2 capsules by mouth at bedtime.   VITAMIN C PO Take by mouth.       All past medical history, surgical history, allergies, family history, immunizations andmedications were updated in the EMR today and reviewed under the history and medication portions of their EMR.     ROS: Negative, with the exception  of above mentioned in HPI   Objective:  Temp 97.9 F (36.6 C) (Temporal)   Resp 18   Ht 5\' 8"  (1.727 m)   Wt 176 lb 8 oz (80.1 kg)   SpO2 97%   BMI 26.84 kg/m  Body mass index is 26.84 kg/m.  Orthostatics BP completed: 108/80 supine, 108/78 sitting, 102/78 standing.  No symptoms. Gen: Afebrile. No acute distress. Nontoxic in appearance, well developed, well nourished.  HENT: AT. [place]. Bilateral TM visualized without erythema or bulging. MMM, no oral lesions. Bilateral nares without erythema, drainage or swelling. Throat without erythema or exudates.  No cough.  No hoarseness. Eyes:Pupils Equal Round Reactive to light, Extraocular movements intact,  Conjunctiva without redness, discharge or icterus. Neck/lymp/endocrine: Supple, no lymphadenopathy CV: RRR no murmur, no edema, carotid bruit present Chest: CTAB, no wheeze or crackles. Good air movement, normal resp effort.  Skin: No rashes, purpura or petechiae.  Neuro:  Normal gait. PERLA. EOMi. Alert. Oriented x3 Psych: Normal affect, dress and demeanor. Normal speech. Normal thought content and judgment.  No exam data present No results found. No results found for this or any previous visit (from the past 24 hour(s)).  Assessment/Plan: Connie Flores is a 64 y.o. female present for OV for  Carotid artery stenosis, asymptomatic, left/Bruit of left carotid artery/ Hyperlipidemia/statin declined Reviewed carotid artery duplex completed in March.  No significant stenosis noted.  Left is 40 to 50% stenosed. Patient had been encouraged on dietary modifications and exercise. She declined all prescribed medications for cholesterol management.  She was encouraged to consider red yeast rice OTC.  Vertigo Patient has chronic history of vertigo, worsening over the last few weeks. Encouraged her to start the Flonase daily> she has at home does not need prescription.  Also consider Allegra daily. Continue hydration. Consider allergy referral if  she desires. Epley maneuver instructions provided.   Consider vestibular rehab; she has had difficulty with this in the past. If continues to worsen or without improvement next 2-4 weeks patient to follow-up and consider neurology referral plus/minus MRI brain.   Reviewed expectations re: course of current medical issues.  Discussed self-management of symptoms.  Outlined signs and symptoms indicating need for more acute intervention.  Patient verbalized understanding and all questions were answered.  Patient received an After-Visit Summary.    No orders of the defined types were placed in this encounter.  No orders of the defined types were placed in this encounter.  Referral Orders  No referral(s) requested today     Note is dictated utilizing voice recognition software. Although note has been proof read prior to signing, occasional typographical errors still can be missed. If any questions arise, please  do not hesitate to call for verification.   electronically signed by:  Howard Pouch, DO  [place]

## 2020-05-22 NOTE — Patient Instructions (Addendum)
Start flonase nasal spray as discussed.  Considering starting allegra (plain) daily> this will not cause sedation and helps with allergies  Hydrate.    How to Perform the Epley Maneuver The Epley maneuver is an exercise that relieves symptoms of vertigo. Vertigo is the feeling that you or your surroundings are moving when they are not. When you feel vertigo, you may feel like the room is spinning and have trouble walking. Dizziness is a little different than vertigo. When you are dizzy, you may feel unsteady or light-headed. You can do this maneuver at home whenever you have symptoms of vertigo. You can do it up to 3 times a day until your symptoms go away. Even though the Epley maneuver may relieve your vertigo for a few weeks, it is possible that your symptoms will return. This maneuver relieves vertigo, but it does not relieve dizziness. What are the risks? If it is done correctly, the Epley maneuver is considered safe. Sometimes it can lead to dizziness or nausea that goes away after a short time. If you develop other symptoms, such as changes in vision, weakness, or numbness, stop doing the maneuver and call your health care provider. How to perform the Epley maneuver 1. Sit on the edge of a bed or table with your back straight and your legs extended or hanging over the edge of the bed or table. 2. Turn your head halfway toward the affected ear or side. 3. Lie backward quickly with your head turned until you are lying flat on your back. You may want to position a pillow under your shoulders. 4. Hold this position for 30 seconds. You may experience an attack of vertigo. This is normal. 5. Turn your head to the opposite direction until your unaffected ear is facing the floor. 6. Hold this position for 30 seconds. You may experience an attack of vertigo. This is normal. Hold this position until the vertigo stops. 7. Turn your whole body to the same side as your head. Hold for another 30  seconds. 8. Sit back up. You can repeat this exercise up to 3 times a day. Follow these instructions at home:  After doing the Epley maneuver, you can return to your normal activities.  Ask your health care provider if there is anything you should do at home to prevent vertigo. He or she may recommend that you: ? Keep your head raised (elevated) with two or more pillows while you sleep. ? Do not sleep on the side of your affected ear. ? Get up slowly from bed. ? Avoid sudden movements during the day. ? Avoid extreme head movement, like looking up or bending over. Contact a health care provider if:  Your vertigo gets worse.  You have other symptoms, including: ? Nausea. ? Vomiting. ? Headache. Get help right away if:  You have vision changes.  You have a severe or worsening headache or neck pain.  You cannot stop vomiting.  You have new numbness or weakness in any part of your body. Summary  Vertigo is the feeling that you or your surroundings are moving when they are not.  The Epley maneuver is an exercise that relieves symptoms of vertigo.  If the Epley maneuver is done correctly, it is considered safe. You can do it up to 3 times a day. This information is not intended to replace advice given to you by your health care provider. Make sure you discuss any questions you have with your health care provider. Document Revised:  10/06/2017 Document Reviewed: 09/13/2016 Elsevier Patient Education  [place].    Vertigo Vertigo is the feeling that you or the things around you are moving when they are not. This feeling can come and go at any time. Vertigo often goes away on its own. This condition can be dangerous if it happens when you are doing activities like driving or working with machines. Your doctor will do tests to find the cause of your vertigo. These tests will also help your doctor decide on the best treatment for you. Follow these instructions at  home: Eating and drinking      Drink enough fluid to keep your pee (urine) pale yellow.  Do not drink alcohol. Activity  Return to your normal activities as told by your doctor. Ask your doctor what activities are safe for you.  In the morning, first sit up on the side of the bed. When you feel okay, stand slowly while you hold onto something until you know that your balance is fine.  Move slowly. Avoid sudden body or head movements or certain positions, as told by your doctor.  Use a cane if you have trouble standing or walking.  Sit down right away if you feel dizzy.  Avoid doing any tasks or activities that can cause danger to you or others if you get dizzy.  Avoid bending down if you feel dizzy. Place items in your home so that they are easy for you to reach without leaning over.  Do not drive or use heavy machinery if you feel dizzy. General instructions  Take over-the-counter and prescription medicines only as told by your doctor.  Keep all follow-up visits as told by your doctor. This is important. Contact a doctor if:  Your medicine does not help your vertigo.  You have a fever.  Your problems get worse or you have new symptoms.  Your family or friends see changes in your behavior.  The feeling of being sick to your stomach gets worse.  Your vomiting gets worse.  You lose feeling (have numbness) in part of your body.  You feel prickling and tingling in a part of your body. Get help right away if:  You have trouble moving or talking.  You are always dizzy.  You pass out (faint).  You get very bad headaches.  You feel weak in your hands, arms, or legs.  You have changes in your hearing.  You have changes in how you see (vision).  You get a stiff neck.  Bright light starts to bother you. Summary  Vertigo is the feeling that you or the things around you are moving when they are not.  Your doctor will do tests to find the cause of your  vertigo.  You may be told to avoid some tasks, positions, or movements.  Contact a doctor if your medicine is not helping, or if you have a fever, new symptoms, or a change in behavior.  Get help right away if you get very bad headaches, or if you have changes in how you speak, hear, or see. This information is not intended to replace advice given to you by your health care provider. Make sure you discuss any questions you have with your health care provider. Document Revised: 09/17/2018 Document Reviewed: 09/17/2018 Elsevier Patient Education  2020 [employer].

## 2020-05-26 ENCOUNTER — Telehealth: Payer: Self-pay | Admitting: Family Medicine

## 2020-05-26 ENCOUNTER — Encounter: Payer: Self-pay | Admitting: Family Medicine

## 2020-05-26 DIAGNOSIS — R42 Dizziness and giddiness: Secondary | ICD-10-CM | POA: Insufficient documentation

## 2020-05-26 NOTE — Telephone Encounter (Signed)
Please call patient: When closing on her chart from Friday, I cannot recall if she decided she did or did not want a referral to allergy.  Please ask her if she wants referral to allergy and if she does please place referral to allergy (asthma and allergy at [facility]) for her allergies and vertigo. Also, please encourage her to follow-up in 3-4 weeks if her vertigo is not improved after starting the changes we discussed.  Thanks!

## 2020-05-26 NOTE — Telephone Encounter (Signed)
Left message to return call to our office.  

## 2020-05-27 NOTE — Telephone Encounter (Signed)
Called patient declined referral at this time will make follow up if no improvement with vertigo in 3 weeks.

## 2020-08-06 ENCOUNTER — Encounter: Payer: Self-pay | Admitting: Family Medicine

## 2020-08-06 ENCOUNTER — Other Ambulatory Visit: Payer: Self-pay

## 2020-08-06 ENCOUNTER — Ambulatory Visit (INDEPENDENT_AMBULATORY_CARE_PROVIDER_SITE_OTHER): Payer: Medicare Other | Admitting: Family Medicine

## 2020-08-06 VITALS — BP 130/77 | HR 65 | Temp 98.7°F | Ht 66.0 in | Wt 174.0 lb

## 2020-08-06 DIAGNOSIS — Z5320 Procedure and treatment not carried out because of patient's decision for unspecified reasons: Secondary | ICD-10-CM

## 2020-08-06 DIAGNOSIS — R42 Dizziness and giddiness: Secondary | ICD-10-CM | POA: Diagnosis not present

## 2020-08-06 DIAGNOSIS — E782 Mixed hyperlipidemia: Secondary | ICD-10-CM

## 2020-08-06 DIAGNOSIS — E663 Overweight: Secondary | ICD-10-CM | POA: Diagnosis not present

## 2020-08-06 DIAGNOSIS — I6522 Occlusion and stenosis of left carotid artery: Secondary | ICD-10-CM

## 2020-08-06 LAB — LIPID PANEL
Cholesterol: 238 mg/dL — ABNORMAL HIGH (ref 0–200)
HDL: 64.5 mg/dL (ref >39.00)
LDL Cholesterol: 159 mg/dL — ABNORMAL HIGH (ref 0–99)
NonHDL: 173.9
Total CHOL/HDL Ratio: 4
Triglycerides: 76 mg/dL (ref 0.0–149.0)
VLDL: 15.2 mg/dL (ref 0.0–40.0)

## 2020-08-06 NOTE — Progress Notes (Signed)
This visit occurred during the SARS-CoV-2 public health emergency.  Safety protocols were in place, including screening questions prior to the visit, additional usage of staff PPE, and extensive cleaning of exam room while observing appropriate contact time as indicated for disinfecting solutions.    Connie Flores , [birth year], 64 y.o., female MRN: [id number] Patient Care Team    Relationship Specialty Notifications Start End  Ma Hillock, DO PCP - General Family Medicine  11/29/19   Joseph Pierini, MD Consulting Physician Obstetrics and Gynecology  01/23/20   Melida Quitter, MD Consulting Physician Otolaryngology  01/23/20   Thornton Park, MD Consulting Physician Gastroenterology  01/31/20     Chief Complaint  Patient presents with  . Hyperlipidemia    has started red yeast; pt is fasting      Subjective: Connie Flores is a 64 y.o. female present for follow up on her hyperlipidemia. She has made dietary changes reducing red meat to once weekly, omitted butter use and decreased consumption of cheese. She has been walking and started red yeast rice.   Lipid Panel     Component Value Date/Time   CHOL 238 (H) 01/31/2020 0939   TRIG 96.0 01/31/2020 0939   HDL 56.50 01/31/2020 0939   CHOLHDL 4 01/31/2020 0939   VLDL 19.2 01/31/2020 0939   LDLCALC 162 (H) 01/31/2020 0939    Prior note:  Pt presents for an OV with complaints of vertigo of worsening over the last few weeks duration.  Associated symptoms include room spinning and feelings of a "heavy head ".  Patient has a history of vertigo in the past and has seen ENT for condition.  She states over the last couple weeks she noticed her vertigo has become worse especially when laying down, looking over her right shoulder or bending over.  She is drinking water to stay hydrated.  No discernible cause has been identified for her vertigo.  In the past she had been told to use Flonase nasal spray, she admits today she has never  started the Flonase nasal spray.  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  Carotid Dopplers 01/2020 resulted with stenosis of right carotid 1-39% and left 40-50%.  Carotid Doppler does mention "torturous "features of bilateral carotid arteries. Patient denies any recent illness, hearing loss, falls or syncope.  Labs 01/31/2020 indicate hyperlipidemia with a total cholesterol 238, HDL 56, LDL 162, triglycerides 96.  Patient declined all prescribed cholesterol medicine.  Depression screen [facility] 2/9 01/31/2020 11/29/2019  Decreased Interest 0 0  Down, Depressed, Hopeless 0 0  PHQ - 2 Score 0 0    No Known Allergies Social History   Social History Narrative   Marital status/children/pets: Divorced.  3 children.  From [place] 2018.   Education/employment: Some college.  Retired   Engineer, materials:      -smoke alarm in the home:Yes     - wears seatbelt: Yes     - Feels safe in their relationships: Yes   Past Medical History:  Diagnosis Date  . Allergy   . Chicken pox   . Heart murmur   . Shingles    Past Surgical History:  Procedure Laterality Date  . WISDOM TOOTH EXTRACTION     Family History  Problem Relation Age of Onset  . Ovarian cancer Sister   . Miscarriages / Stillbirths Sister   . Colon polyps Sister   . Diabetes Brother   . Prostate cancer Brother   . Drug abuse Daughter   .  Mental illness Daughter   . Prostate cancer Brother   . Colon polyps Brother   . Prostate cancer Brother   . Colon cancer Neg Hx   . Esophageal cancer Neg Hx   . Rectal cancer Neg Hx   . Stomach cancer Neg Hx    Allergies as of 08/06/2020   No Known Allergies     Medication List       Accurate as of August 06, 2020  8:52 AM. If you have any questions, ask your nurse or doctor.        STOP taking these medications   Melatonin 2.5 MG Caps Stopped by: Howard Pouch, DO   VITAMIN C PO Stopped by: Howard Pouch, DO     TAKE these medications   b complex vitamins tablet Take 1  tablet by mouth daily.   Black Elderberry(Berry-Flower) 575 MG Caps Take 1 capsule by mouth daily.   CoQ10 100 MG Caps Take by mouth. Daily   LYSINE PO Take by mouth. PRN for cold sores   Magnesium 250 MG Tabs Take 1 tablet by mouth daily.   Red Yeast Rice 600 MG Tabs Take by mouth.       All past medical history, surgical history, allergies, family history, immunizations andmedications were updated in the EMR today and reviewed under the history and medication portions of their EMR.     ROS: Negative, with the exception of above mentioned in HPI  Objective:  BP 130/77   Pulse 65   Temp 98.7 F (37.1 C) (Oral)   Ht 5\' 6"  (1.676 m)   Wt 174 lb (78.9 kg)   SpO2 100%   BMI 28.08 kg/m  Body mass index is 28.08 kg/m.  Gen: Afebrile. No acute distress. Nontoxic.  HENT: AT. [place].  CV: RRR no murmurs Chest: CTAB, no wheeze or crackles Neuro: Normal gait. PERLA. EOMi. Alert. Oriented Psych: Normal affect, dress and demeanor. Normal speech. Normal thought content and judgment.    No exam data present No results found. No results found for this or any previous visit (from the past 24 hour(s)).  Assessment/Plan: Connie Flores is a 64 y.o. female present for OV for  Carotid artery stenosis, asymptomatic, left/Bruit of left carotid artery/ Hyperlipidemia/statin declined Reviewed carotid artery duplex completed in March.  No significant stenosis noted.  Left is 40 to 50% stenosed. Patient had been encouraged on dietary modifications and exercise. She has made dietary changes.  Lipid panel  Collected today She declined all prescribed medications for cholesterol management.  Continue  red yeast rice OTC.  Vertigo Resolved.  Continue hydration. Home Epley maneuver has resolved her issues.     Reviewed expectations re: course of current medical issues.  Discussed self-management of symptoms.  Outlined signs and symptoms indicating need for more acute  intervention.  Patient verbalized understanding and all questions were answered.  Patient received an After-Visit Summary.    Orders Placed This Encounter  Procedures  . Lipid panel   No orders of the defined types were placed in this encounter.  Referral Orders  No referral(s) requested today     Note is dictated utilizing voice recognition software. Although note has been proof read prior to signing, occasional typographical errors still can be missed. If any questions arise, please do not hesitate to call for verification.   electronically signed by:  Howard Pouch, DO  [place]

## 2020-08-06 NOTE — Patient Instructions (Signed)
Great to see you today.  Thank you so much for thinking of me with the towels.  Keep up the exercise and diet changes.    Preventing High Cholesterol Cholesterol is a white, waxy substance similar to fat that the human body needs to help build cells. The liver makes all the cholesterol that a person's body needs. Having high cholesterol (hypercholesterolemia) increases a person's risk for heart disease and stroke. Extra (excess) cholesterol comes from the food the person eats. High cholesterol can often be prevented with diet and lifestyle changes. If you already have high cholesterol, you can control it with diet and lifestyle changes and with medicine. How can high cholesterol affect me? If you have high cholesterol, deposits (plaques) may build up on the walls of your arteries. The arteries are the blood vessels that carry blood away from your heart. Plaques make the arteries narrower and stiffer. This can limit or block blood flow and cause blood clots to form. Blood clots:  Are tiny balls of cells that form in your blood.  Can move to the heart or brain, causing a heart attack or stroke. Plaques in arteries greatly increase your risk for heart attack and stroke.Making diet and lifestyle changes can reduce your risk for these conditions that may threaten your life. What can increase my risk? This condition is more likely to develop in people who:  Eat foods that are high in saturated fat or cholesterol. Saturated fat is mostly found in: ? Foods that contain animal fat, such as red meat and some dairy products. ? Certain fatty foods made from plants, such as tropical oils.  Are overweight.  Are not getting enough exercise.  Have a family history of high cholesterol. What actions can I take to prevent this? Nutrition   Eat less saturated fat.  Avoid trans fats (partially hydrogenated oils). These are often found in margarine and in some baked goods, fried foods, and snacks bought  in packages.  Avoid precooked or cured meat, such as sausages or meat loaves.  Avoid foods and drinks that have added sugars.  Eat more fruits, vegetables, and whole grains.  Choose healthy sources of protein, such as fish, poultry, lean cuts of red meat, beans, peas, lentils, and nuts.  Choose healthy sources of fat, such as: ? Nuts. ? Vegetable oils, especially olive oil. ? Fish that have healthy fats (omega-3 fatty acids), such as mackerel or salmon. The items listed above may not be a complete list of recommended foods and beverages. Contact a dietitian for more information. Lifestyle  Lose weight if you are overweight. Losing 5-10 lb (2.3-4.5 kg) can help prevent or control high cholesterol. It can also lower your risk for diabetes and high blood pressure. Ask your health care provider to help you with a diet and exercise plan to lose weight safely.  Do not use any products that contain nicotine or tobacco, such as cigarettes, e-cigarettes, and chewing tobacco. If you need help quitting, ask your health care provider.  Limit your alcohol intake. ? Do not drink alcohol if:  Your health care provider tells you not to drink.  You are pregnant, may be pregnant, or are planning to become pregnant. ? If you drink alcohol:  Limit how much you use to:  0-1 drink a day for women.  0-2 drinks a day for men.  Be aware of how much alcohol is in your drink. In the [place] one drink equals one 12 oz bottle of beer (355 mL),  one 5 oz glass of wine (148 mL), or one 1 oz glass of hard liquor (44 mL). Activity   Get enough exercise. Each week, do at least 150 minutes of exercise that takes a medium level of effort (moderate-intensity exercise). ? This is exercise that:  Makes your heart beat faster and makes you breathe harder than usual.  Allows you to still be able to talk. ? You could exercise in short sessions several times a day or longer sessions a few times a week. For example,  on 5 days each week, you could walk fast or ride your bike 3 times a day for 10 minutes each time.  Do exercises as told by your health care provider. Medicines  In addition to diet and lifestyle changes, your health care provider may recommend medicines to help lower cholesterol. This may be a medicine to lower the amount of cholesterol your liver makes. You may need medicine if: ? Diet and lifestyle changes do not lower your cholesterol enough. ? You have high cholesterol and other risk factors for heart disease or stroke.  Take over-the-counter and prescription medicines only as told by your health care provider. General information  Manage your risk factors for high cholesterol. Talk with your health care provider about all your risk factors and how to lower your risk.  Manage other conditions that you have, such as diabetes or high blood pressure (hypertension).  Have blood tests to check your cholesterol levels at regular points in time as told by your health care provider.  Keep all follow-up visits as told by your health care provider. This is important. Where to find more information  American Heart Association: www.heart.org  National Heart, Lung, and Blood Institute: https://wilson-eaton.com/ Summary  High cholesterol increases your risk for heart disease and stroke. By keeping your cholesterol level low, you can reduce your risk for these conditions.  High cholesterol can often be prevented with diet and lifestyle changes.  Work with your health care provider to manage your risk factors, and have your blood tested regularly. This information is not intended to replace advice given to you by your health care provider. Make sure you discuss any questions you have with your health care provider. Document Revised: 02/15/2019 Document Reviewed: 07/02/2016 Elsevier Patient Education  2020 [employer].

## 2020-08-10 ENCOUNTER — Telehealth: Payer: Self-pay | Admitting: Family Medicine

## 2020-08-10 NOTE — Telephone Encounter (Signed)
Her cholesterol panel is almost exactly the same as prior with a total cholesterol of 238, HDL 64 and an LDL of 159.  I know this can be frustrating considering all the dietary changes she has reported.  Which are helpful for her over cardiac health.  However her cholesterol is still high enough to put her at an increased cardiovascular risk of heart attack or stroke. She reported taking 1 tab of red yeast rice 600 mg daily.  I would encourage her to increase to 2 tabs.  She has declined statin medications in the past to help with her cholesterol, we could try Zetia which is not a statin medication and can be helpful if she is agreeable I will call this in for her. Please advise me of her decision.

## 2020-08-13 NOTE — Telephone Encounter (Signed)
LVM for pt to CB regarding results.  

## 2020-08-14 NOTE — Telephone Encounter (Signed)
Pt states that she haven't been 100% compliant with red yeast. Pt will send message over weekend to confirm dose she is currently taking 2 tabs. Pt agrees to Zetia along as she can eventually d/c once numbers go to goal and keep it there.

## 2020-08-15 ENCOUNTER — Encounter: Payer: Self-pay | Admitting: Family Medicine

## 2020-08-17 NOTE — Telephone Encounter (Signed)
Pt confirm that she is taking 2 600 mg tabs.

## 2020-08-20 MED ORDER — EZETIMIBE 10 MG PO TABS: 10.0000 mg | ORAL_TABLET | Freq: Every day | ORAL | 3 refills | Status: DC

## 2020-08-20 NOTE — Telephone Encounter (Signed)
Zetia called in, please make patient aware.  Thanks

## 2020-08-20 NOTE — Addendum Note (Signed)
Addended by: Howard Pouch A on: 08/20/2020 07:19 AM   Modules accepted: Orders

## 2020-12-07 ENCOUNTER — Telehealth: Payer: Self-pay | Admitting: Family Medicine

## 2020-12-07 NOTE — Telephone Encounter (Signed)
If fever, rash, headache or muscle aches develop she needs to be seen immediately.

## 2020-12-07 NOTE — Telephone Encounter (Signed)
Please advise 

## 2020-12-07 NOTE — Telephone Encounter (Signed)
Patient found tick on her left side last night. She was able to remove it and now she has a small red dot, not raised or itchy. Patient does not feel she needs an appointment, just would like advice on caring for this and what to watch for. Please call patient to advise.

## 2020-12-07 NOTE — Telephone Encounter (Signed)
Spoke with provider's instructions

## 2021-02-02 ENCOUNTER — Other Ambulatory Visit: Payer: Self-pay

## 2021-02-02 NOTE — Progress Notes (Signed)
Subjective:   Connie Flores is a 65 y.o. female who presents for Medicare Annual (Subsequent) preventive examination.  Review of Systems     Cardiac Risk Factors include: advanced age (>39men, >41 women);dyslipidemia;sedentary lifestyle     Objective:    Today's Vitals   02/03/21 0906  BP: 121/75  Pulse: 65  Resp: 16  Temp: 98.1 F (36.7 C)  TempSrc: Oral  SpO2: 98%  Weight: 174 lb (78.9 kg)  Height: 5\' 6"  (1.676 m)   Body mass index is 28.08 kg/m.  Advanced Directives 02/03/2021 01/31/2020 12/18/2019  Does Patient Have a Medical Advance Directive? Yes No No  Type of Advance Directive [place] in Chart? No - copy requested - -  Would patient like information on creating a medical advance directive? - Yes (ED - Information included in AVS) No - Patient declined    Current Medications (verified) Outpatient Encounter Medications as of 02/03/2021  Medication Sig  . b complex vitamins tablet Take 1 tablet by mouth daily.  . Black Elderberry,Berry-Flower, 575 MG CAPS Take 1 capsule by mouth daily.  . Coenzyme Q10 (COQ10) 100 MG CAPS Take by mouth. Daily  . LYSINE PO Take by mouth. PRN for cold sores  . Red Yeast Rice 600 MG TABS Take 1,200 mg by mouth daily.  Marland Kitchen VITAMIN D PO Take by mouth.  . ezetimibe (ZETIA) 10 MG tablet Take 1 tablet (10 mg total) by mouth daily. (Patient not taking: No sig reported)  . [DISCONTINUED] Magnesium 250 MG TABS Take 1 tablet by mouth daily. (Patient not taking: Reported on 02/03/2021)   No facility-administered encounter medications on file as of 02/03/2021.    Allergies (verified) Patient has no known allergies.   History: Past Medical History:  Diagnosis Date  . Allergy   . Chicken pox   . Heart murmur   . Shingles    Past Surgical History:  Procedure Laterality Date  . WISDOM TOOTH EXTRACTION     Family History  Problem Relation Age of Onset  . Ovarian cancer Sister         died Mar 12, 2020  . Miscarriages / Stillbirths Sister   . Colon polyps Sister   . Diabetes Brother   . Prostate cancer Brother   . Hyperlipidemia Brother   . Drug abuse Daughter   . Mental illness Daughter   . Prostate cancer Brother   . Colon polyps Brother   . Hyperlipidemia Brother   . Prostate cancer Brother   . Colon cancer Neg Hx   . Esophageal cancer Neg Hx   . Rectal cancer Neg Hx   . Stomach cancer Neg Hx    Social History   Socioeconomic History  . Marital status: Single    Spouse name: Not on file  . Number of children: Not on file  . Years of education: Not on file  . Highest education level: Not on file  Occupational History  . Occupation: retired  Tobacco Use  . Smoking status: Former Smoker    Packs/day: 1.00    Years: 3.00    Pack years: 3.00    Types: Cigarettes  . Smokeless tobacco: Never Used  . Tobacco comment: quit in the 70's   Vaping Use  . Vaping Use: Never used  Substance and Sexual Activity  . Alcohol use: Yes    Alcohol/week: 4.0 standard drinks    Types: 4 Glasses of wine per week  .  Drug use: Never  . Sexual activity: Not Currently    Comment: 1st intercourse 18 yo-5 partners  Other Topics Concern  . Not on file  Social History Narrative   Marital status/children/pets: Divorced.  3 children.  From [place] 2018.   Education/employment: Some college.  Retired   Engineer, materials:      -smoke alarm in the home:Yes     - wears seatbelt: Yes     - Feels safe in their relationships: Yes   Social Determinants of Health   Financial Resource Strain: Low Risk   . Difficulty of Paying Living Expenses: Not hard at all  Food Insecurity: No Food Insecurity  . Worried About Charity fundraiser in the Last Year: Never true  . Ran Out of Food in the Last Year: Never true  Transportation Needs: No Transportation Needs  . Lack of Transportation (Medical): No  . Lack of Transportation (Non-Medical): No  Physical Activity: Sufficiently Active  . Days  of Exercise per Week: 7 days  . Minutes of Exercise per Session: 30 min  Stress: No Stress Concern Present  . Feeling of Stress : Not at all  Social Connections: Moderately Integrated  . Frequency of Communication with Friends and Family: More than three times a week  . Frequency of Social Gatherings with Friends and Family: More than three times a week  . Attends Religious Services: More than 4 times per year  . Active Member of Clubs or Organizations: Yes  . Attends Archivist Meetings: More than 4 times per year  . Marital Status: Divorced    Tobacco Counseling Counseling given: Not Answered Comment: quit in the 70's    Clinical Intake:  Pre-visit preparation completed: Yes  Pain : No/denies pain     Nutritional Status: BMI 25 -29 Overweight Nutritional Risks: None Diabetes: No  How often do you need to have someone help you when you read instructions, pamphlets, or other written materials from your doctor or pharmacy?: 1 - Never  Diabetic?No  Interpreter Needed?: No  Information entered by :: Caroleen Hamman LPN   Activities of Daily Living In your present state of health, do you have any difficulty performing the following activities: 02/03/2021  Hearing? N  Vision? N  Difficulty concentrating or making decisions? N  Walking or climbing stairs? N  Dressing or bathing? N  Doing errands, shopping? N  Preparing Food and eating ? N  Using the Toilet? N  In the past six months, have you accidently leaked urine? N  Do you have problems with loss of bowel control? N  Managing your Medications? N  Managing your Finances? N  Housekeeping or managing your Housekeeping? N  Some recent data might be hidden    Patient Care Team: Ma Hillock, DO as PCP - General (Family Medicine) Joseph Pierini, MD as Consulting Physician (Obstetrics and Gynecology) Melida Quitter, MD as Consulting Physician (Otolaryngology) Thornton Park, MD as Consulting  Physician (Gastroenterology)  Indicate any recent Medical Services you may have received from other than [facility] providers in the past year (date may be approximate).     Assessment:   This is a routine wellness examination for Connie Flores.  Hearing/Vision screen  Hearing Screening   125Hz  250Hz  500Hz  1000Hz  2000Hz  3000Hz  4000Hz  6000Hz  8000Hz   Right ear:           Left ear:           Comments: No issues  Vision Screening Comments: Reading glasses Last eye  exam-4 yrs  Dietary issues and exercise activities discussed: Current Exercise Habits: Home exercise routine, Type of exercise: walking, Time (Minutes): 30, Frequency (Times/Week): 7, Weekly Exercise (Minutes/Week): 210, Intensity: Mild, Exercise limited by: None identified  Goals    . Patient Stated     Eat healthy, increase activity, continue drinking water & lose 10 pounds      Depression Screen PHQ 2/9 Scores 02/03/2021 02/03/2021 01/31/2020 11/29/2019  PHQ - 2 Score 0 0 0 0    Fall Risk Fall Risk  02/03/2021 02/03/2021 01/31/2020  Falls in the past year? 0 0 0  Number falls in past yr: 0 0 -  Injury with Fall? 0 0 -  Follow up Falls prevention discussed - Falls evaluation completed;Education provided;Falls prevention discussed    FALL RISK PREVENTION PERTAINING TO THE HOME:  Any stairs in or around the home? Yes  If so, are there any without handrails? No  Home free of loose throw rugs in walkways, pet beds, electrical cords, etc? Yes  Adequate lighting in your home to reduce risk of falls? Yes   ASSISTIVE DEVICES UTILIZED TO PREVENT FALLS:  Life alert? No  Use of a cane, walker or w/c? No  Grab bars in the bathroom? Yes  Shower chair or bench in shower? No  Elevated toilet seat or a handicapped toilet? No   TIMED UP AND GO:  Was the test performed? Yes .  Length of time to ambulate 10 feet: 9 sec.   Gait steady and fast without use of assistive device  Cognitive Function:Normal cognitive status assessed by direct  observation by this Nurse Health Advisor. No abnormalities found.          Immunizations Immunization History  Administered Date(s) Administered  . Influenza,inj,Quad PF,6+ Mos 08/08/2019  . Influenza-Unspecified 09/05/2020  . Janssen (J&J) SARS-COV-2 Vaccination 01/20/2020, 11/14/2020  . Td 07/31/2017  . Tdap 08/07/2017    TDAP status: Up to date  Flu Vaccine status: Up to date  Pneumococcal vaccine status: Due, Education has been provided regarding the importance of this vaccine. Advised may receive this vaccine at local pharmacy or Health Dept. Aware to provide a copy of the vaccination record if obtained from local pharmacy or Health Dept. Verbalized acceptance and understanding.  Covid-19 vaccine status: Completed vaccines  Qualifies for Shingles Vaccine? Yes   Zostavax completed No   Shingrix Completed?: No.    Education has been provided regarding the importance of this vaccine. Patient has been advised to call insurance company to determine out of pocket expense if they have not yet received this vaccine. Advised may also receive vaccine at local pharmacy or Health Dept. Verbalized acceptance and understanding.  Screening Tests Health Maintenance  Topic Date Due  . MAMMOGRAM  08/06/2021 (Originally 11/07/2005)  . COLONOSCOPY (Pts 45-23yrs Insurance coverage will need to be confirmed)  12/17/2024  . INFLUENZA VACCINE  Completed  . DEXA SCAN  Completed  . COVID-19 Vaccine  Completed  . Hepatitis C Screening  Completed  . HPV VACCINES  Aged Out  . TETANUS/TDAP  Discontinued  . PNA vac Low Risk Adult  Discontinued    Health Maintenance  There are no preventive care reminders to display for this patient.  Colorectal cancer screening: Type of screening: Colonoscopy. Completed 12/18/2019. Repeat every 5 years  Mammogram status: Due-Patient states she will contact GYN to schedule  Bone Density status: Completed 01/28/2020. Results reflect: Bone density results: NORMAL.  Repeat every 2 years.  Lung Cancer Screening: (Low Dose  CT Chest recommended if Age 18-80 years, 30 pack-year currently smoking OR have quit w/in 15years.) does not qualify.     Additional Screening:  Hepatitis C Screening: Completed 01/31/2020  Vision Screening: Recommended annual ophthalmology exams for early detection of glaucoma and other disorders of the eye. Is the patient up to date with their annual eye exam?  No  Who is the provider or what is the name of the office in which the patient attends annual eye exams? Unsure Patient plans to make an appt soon   Dental Screening: Recommended annual dental exams for proper oral hygiene  Community Resource Referral / Chronic Care Management: CRR required this visit?  No   CCM required this visit?  No      Plan:     I have personally reviewed and noted the following in the patient's chart:   . Medical and social history . Use of alcohol, tobacco or illicit drugs  . Current medications and supplements . Functional ability and status . Nutritional status . Physical activity . Advanced directives . List of other physicians . Hospitalizations, surgeries, and ER visits in previous 12 months . Vitals . Screenings to include cognitive, depression, and falls . Referrals and appointments  In addition, I have reviewed and discussed with patient certain preventive protocols, quality metrics, and best practice recommendations. A written personalized care plan for preventive services as well as general preventive health recommendations were provided to patient.     Marta Antu, LPN   3/40/[id number]  Nurse Health Advisor  Nurse Notes: None

## 2021-02-03 ENCOUNTER — Ambulatory Visit (INDEPENDENT_AMBULATORY_CARE_PROVIDER_SITE_OTHER): Payer: Medicare Other

## 2021-02-03 ENCOUNTER — Encounter: Payer: Self-pay | Admitting: Family Medicine

## 2021-02-03 ENCOUNTER — Ambulatory Visit (INDEPENDENT_AMBULATORY_CARE_PROVIDER_SITE_OTHER): Payer: Medicare Other | Admitting: Family Medicine

## 2021-02-03 VITALS — BP 121/75 | HR 65 | Temp 98.1°F | Ht 66.0 in | Wt 174.0 lb

## 2021-02-03 VITALS — BP 121/75 | HR 65 | Temp 98.1°F | Resp 16 | Ht 66.0 in | Wt 174.0 lb

## 2021-02-03 DIAGNOSIS — E782 Mixed hyperlipidemia: Secondary | ICD-10-CM | POA: Diagnosis not present

## 2021-02-03 DIAGNOSIS — R7309 Other abnormal glucose: Secondary | ICD-10-CM | POA: Diagnosis not present

## 2021-02-03 DIAGNOSIS — Z0000 Encounter for general adult medical examination without abnormal findings: Secondary | ICD-10-CM

## 2021-02-03 DIAGNOSIS — I6522 Occlusion and stenosis of left carotid artery: Secondary | ICD-10-CM | POA: Diagnosis not present

## 2021-02-03 DIAGNOSIS — E663 Overweight: Secondary | ICD-10-CM | POA: Diagnosis not present

## 2021-02-03 DIAGNOSIS — R42 Dizziness and giddiness: Secondary | ICD-10-CM

## 2021-02-03 DIAGNOSIS — L989 Disorder of the skin and subcutaneous tissue, unspecified: Secondary | ICD-10-CM

## 2021-02-03 DIAGNOSIS — Z5320 Procedure and treatment not carried out because of patient's decision for unspecified reasons: Secondary | ICD-10-CM | POA: Diagnosis not present

## 2021-02-03 LAB — LIPID PANEL
Cholesterol: 215 mg/dL — ABNORMAL HIGH (ref 0–200)
HDL: 56.1 mg/dL (ref >39.00)
LDL Cholesterol: 132 mg/dL — ABNORMAL HIGH (ref 0–99)
NonHDL: 158.87
Total CHOL/HDL Ratio: 4
Triglycerides: 133 mg/dL (ref 0.0–149.0)
VLDL: 26.6 mg/dL (ref 0.0–40.0)

## 2021-02-03 LAB — COMPREHENSIVE METABOLIC PANEL
ALT: 16 U/L (ref 0–35)
AST: 19 U/L (ref 0–37)
Albumin: 4.4 g/dL (ref 3.5–5.2)
Alkaline Phosphatase: 48 U/L (ref 39–117)
BUN: 13 mg/dL (ref 6–23)
CO2: 29 mEq/L (ref 19–32)
Calcium: 9.5 mg/dL (ref 8.4–10.5)
Chloride: 104 mEq/L (ref 96–112)
Creatinine, Ser: 0.67 mg/dL (ref 0.40–1.20)
GFR: 90.63 mL/min (ref >60.00)
Glucose, Bld: 101 mg/dL — ABNORMAL HIGH (ref 70–99)
Potassium: 4 mEq/L (ref 3.5–5.1)
Sodium: 141 mEq/L (ref 135–145)
Total Bilirubin: 0.4 mg/dL (ref 0.2–1.2)
Total Protein: 6.8 g/dL (ref 6.0–8.3)

## 2021-02-03 LAB — CBC WITH DIFFERENTIAL/PLATELET
Basophils Absolute: 0 10*3/uL (ref 0.0–0.1)
Basophils Relative: 0.9 % (ref 0.0–3.0)
Eosinophils Absolute: 0.1 10*3/uL (ref 0.0–0.7)
Eosinophils Relative: 2.3 % (ref 0.0–5.0)
HCT: 38.9 % (ref 36.0–46.0)
Hemoglobin: 12.9 g/dL (ref 12.0–15.0)
Lymphocytes Relative: 33.7 % (ref 12.0–46.0)
Lymphs Abs: 1.1 10*3/uL (ref 0.7–4.0)
MCHC: 33.2 g/dL (ref 30.0–36.0)
MCV: 93.3 fl (ref 78.0–100.0)
Monocytes Absolute: 0.5 10*3/uL (ref 0.1–1.0)
Monocytes Relative: 15.2 % — ABNORMAL HIGH (ref 3.0–12.0)
Neutro Abs: 1.5 10*3/uL (ref 1.4–7.7)
Neutrophils Relative %: 47.9 % (ref 43.0–77.0)
Platelets: 216 10*3/uL (ref 150.0–400.0)
RBC: 4.17 Mil/uL (ref 3.87–5.11)
RDW: 14.1 % (ref 11.5–15.5)
WBC: 3.2 10*3/uL — ABNORMAL LOW (ref 4.0–10.5)

## 2021-02-03 LAB — TSH: TSH: 2.01 u[IU]/mL (ref 0.35–4.50)

## 2021-02-03 LAB — HEMOGLOBIN A1C: Hgb A1c MFr Bld: 5.6 % (ref 4.6–6.5)

## 2021-02-03 NOTE — Progress Notes (Signed)
This visit occurred during the SARS-CoV-2 public health emergency.  Safety protocols were in place, including screening questions prior to the visit, additional usage of staff PPE, and extensive cleaning of exam room while observing appropriate contact time as indicated for disinfecting solutions.    Connie Flores , [birth year], 65 y.o., female MRN: [id number] Patient Care Team    Relationship Specialty Notifications Start End  Ma Hillock, DO PCP - General Family Medicine  11/29/19   Joseph Pierini, MD Consulting Physician Obstetrics and Gynecology  01/23/20   Melida Quitter, MD Consulting Physician Otolaryngology  01/23/20   Thornton Park, MD Consulting Physician Gastroenterology  01/31/20     Chief Complaint  Patient presents with  . Follow-up    Pt is fasting;      Subjective: Connie Flores is a 65 y.o. female present for follow up on her hyperlipidemia. She has made  dietary changes reducing red meat to once weekly, omitted butter use and decreased consumption of cheese. She has been walking, but has not increased her exercise routine. She is taking red yeast rice, but did not start the zetia.    She also has a skin lesion that is new on her right breast and would like a referral to dermatology.    Lipid Panel     Component Value Date/Time   CHOL 238 (H) 08/06/2020 0830   TRIG 76.0 08/06/2020 0830   HDL 64.50 08/06/2020 0830   CHOLHDL 4 08/06/2020 0830   VLDL 15.2 08/06/2020 0830   LDLCALC 159 (H) 08/06/2020 0830    Prior note:  Pt presents for an OV with complaints of vertigo of worsening over the last few weeks duration.  Associated symptoms include room spinning and feelings of a "heavy head ".  Patient has a history of vertigo in the past and has seen ENT for condition.  She states over the last couple weeks she noticed her vertigo has become worse especially when laying down, looking over her right shoulder or bending over.  She is drinking water to stay  hydrated.  No discernible cause has been identified for her vertigo.  In the past she had been told to use Flonase nasal spray, she admits today she has never started the Flonase nasal spray.  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  Carotid Dopplers 01/2020 resulted with stenosis of right carotid 1-39% and left 40-50%.  Carotid Doppler does mention "torturous "features of bilateral carotid arteries. Patient denies any recent illness, hearing loss, falls or syncope.  Labs 01/31/2020 indicate hyperlipidemia with a total cholesterol 238, HDL 56, LDL 162, triglycerides 96.  Patient declined all prescribed cholesterol medicine.  Depression screen [facility] 2/9 02/03/2021 02/03/2021 01/31/2020 11/29/2019  Decreased Interest 0 0 0 0  Down, Depressed, Hopeless 0 0 0 0  PHQ - 2 Score 0 0 0 0    No Known Allergies Social History   Social History Narrative   Marital status/children/pets: Divorced.  3 children.  From [place] 2018.   Education/employment: Some college.  Retired   Engineer, materials:      -smoke alarm in the home:Yes     - wears seatbelt: Yes     - Feels safe in their relationships: Yes   Past Medical History:  Diagnosis Date  . Allergy   . Chicken pox   . Heart murmur   . Shingles    Past Surgical History:  Procedure Laterality Date  . WISDOM TOOTH EXTRACTION  Family History  Problem Relation Age of Onset  . Ovarian cancer Sister        died 2020/03/21  . Miscarriages / Stillbirths Sister   . Colon polyps Sister   . Diabetes Brother   . Prostate cancer Brother   . Hyperlipidemia Brother   . Drug abuse Daughter   . Mental illness Daughter   . Prostate cancer Brother   . Colon polyps Brother   . Hyperlipidemia Brother   . Prostate cancer Brother   . Colon cancer Neg Hx   . Esophageal cancer Neg Hx   . Rectal cancer Neg Hx   . Stomach cancer Neg Hx    Allergies as of 02/03/2021   No Known Allergies     Medication List       Accurate as of February 03, 2021 11:31 AM.  If you have any questions, ask your nurse or doctor.        STOP taking these medications   Magnesium 250 MG Tabs Stopped by: Howard Pouch, DO     TAKE these medications   b complex vitamins tablet Take 1 tablet by mouth daily.   Black Elderberry(Berry-Flower) 575 MG Caps Take 1 capsule by mouth daily.   CoQ10 100 MG Caps Take by mouth. Daily   ezetimibe 10 MG tablet Commonly known as: Zetia Take 1 tablet (10 mg total) by mouth daily.   LYSINE PO Take by mouth. PRN for cold sores   Red Yeast Rice 600 MG Tabs Take 1,200 mg by mouth daily.   VITAMIN D PO Take by mouth.       All past medical history, surgical history, allergies, family history, immunizations andmedications were updated in the EMR today and reviewed under the history and medication portions of their EMR.     ROS: Negative, with the exception of above mentioned in HPI  Objective:  BP 121/75   Pulse 65   Temp 98.1 F (36.7 C) (Oral)   Ht 5\' 6"  (1.676 m)   Wt 174 lb (78.9 kg)   SpO2 98%   BMI 28.08 kg/m  Body mass index is 28.08 kg/m.  Gen: Afebrile. No acute distress. Very pleasant female.  HENT: AT. [place].  Eyes:Pupils Equal Round Reactive to light, Extraocular movements intact,  Conjunctiva without redness, discharge or icterus. Neck/lymp/endocrine: Supple,no lymphadenopathy, no thyromegaly CV: RRR no murmurn, no edema, +2/4 P posterior tibialis pulses Chest: CTAB, no wheeze or crackles Abd: Soft. NTND. BS present. no Masses palpated.  Skin: no rashes, purpura or petechiae.  Neuro: Normal gait. PERLA. EOMi. Alert. Orientedx3 Psych: Normal affect, dress and demeanor. Normal speech. Normal thought content and judgment.  No exam data present No results found. No results found for this or any previous visit (from the past 24 hour(s)).  Assessment/Plan: Connie Flores is a 65 y.o. female present for OV for  Carotid artery stenosis, asymptomatic, left/Bruit of left carotid artery/  Hyperlipidemia/statin declined Reviewed carotid artery duplex completed in March2021.  No significant stenosis noted.  Left is 40 to 50% stenosed. Patient had been encouraged on dietary modifications and exercise. She has made dietary changes.  Lipid panel  Collected again today> she did not start zetia last time, but states today if LDL still high she will start it this time.  She declined all other statins and cholesterol meds.  Continue   red yeast rice OTC.  Vertigo resolved Continue hydration. Home Epley maneuver has resolved her issues.   Medicare wellness completed by Health coach  today as well.    Reviewed expectations re: course of current medical issues.  Discussed self-management of symptoms.  Outlined signs and symptoms indicating need for more acute intervention.  Patient verbalized understanding and all questions were answered.  Patient received an After-Visit Summary.    Orders Placed This Encounter  Procedures  . CBC with Differential/Platelet  . Comprehensive metabolic panel  . Hemoglobin A1c  . Lipid panel  . TSH  . Ambulatory referral to Dermatology   No orders of the defined types were placed in this encounter.   Referral Orders     Ambulatory referral to Dermatology   Note is dictated utilizing voice recognition software. Although note has been proof read prior to signing, occasional typographical errors still can be missed. If any questions arise, please do not hesitate to call for verification.   electronically signed by:  Howard Pouch, DO  [place]

## 2021-02-03 NOTE — Patient Instructions (Signed)
Connie Flores , Thank you for taking time to come for your Medicare Wellness Visit. I appreciate your ongoing commitment to your health goals. Please review the following plan we discussed and let me know if I can assist you in the future.   Screening recommendations/referrals: Colonoscopy: Completed 12/18/2019-Due 12/17/2024 Mammogram: Due-Per our conversation, you will call GYN to schedule. Bone Density: Completed 01/28/2020-Due 01/27/2022 Recommended yearly ophthalmology/optometry visit for glaucoma screening and checkup Recommended yearly dental visit for hygiene and checkup  Vaccinations: Influenza vaccine: Up to date Pneumococcal vaccine: Due-May obtain vaccine at our office or your local pharmacy. Tdap vaccine: Up to date-Due 08/07/2017 Shingles vaccine: Discuss with pharmacy   Covid-19:Completed vaccines  Advanced directives: Please bring a copy for your chart.  Conditions/risks identified: See problem list  Next appointment: Follow up in one year for your annual wellness visit    Preventive Care 65 Years and Older, Female Preventive care refers to lifestyle choices and visits with your health care provider that can promote health and wellness. What does preventive care include?  A yearly physical exam. This is also called an annual well check.  Dental exams once or twice a year.  Routine eye exams. Ask your health care provider how often you should have your eyes checked.  Personal lifestyle choices, including:  Daily care of your teeth and gums.  Regular physical activity.  Eating a healthy diet.  Avoiding tobacco and drug use.  Limiting alcohol use.  Practicing safe sex.  Taking low-dose aspirin every day.  Taking vitamin and mineral supplements as recommended by your health care provider. What happens during an annual well check? The services and screenings done by your health care provider during your annual well check will depend on your age, overall  health, lifestyle risk factors, and family history of disease. Counseling  Your health care provider may ask you questions about your:  Alcohol use.  Tobacco use.  Drug use.  Emotional well-being.  Home and relationship well-being.  Sexual activity.  Eating habits.  History of falls.  Memory and ability to understand (cognition).  Work and work Statistician.  Reproductive health. Screening  You may have the following tests or measurements:  Height, weight, and BMI.  Blood pressure.  Lipid and cholesterol levels. These may be checked every 5 years, or more frequently if you are over 22 years old.  Skin check.  Lung cancer screening. You may have this screening every year starting at age 36 if you have a 30-pack-year history of smoking and currently smoke or have quit within the past 15 years.  Fecal occult blood test (FOBT) of the stool. You may have this test every year starting at age 83.  Flexible sigmoidoscopy or colonoscopy. You may have a sigmoidoscopy every 5 years or a colonoscopy every 10 years starting at age 13.  Hepatitis C blood test.  Hepatitis B blood test.  Sexually transmitted disease (STD) testing.  Diabetes screening. This is done by checking your blood sugar (glucose) after you have not eaten for a while (fasting). You may have this done every 1-3 years.  Bone density scan. This is done to screen for osteoporosis. You may have this done starting at age 20.  Mammogram. This may be done every 1-2 years. Talk to your health care provider about how often you should have regular mammograms. Talk with your health care provider about your test results, treatment options, and if necessary, the need for more tests. Vaccines  Your health care provider may  recommend certain vaccines, such as:  Influenza vaccine. This is recommended every year.  Tetanus, diphtheria, and acellular pertussis (Tdap, Td) vaccine. You may need a Td booster every 10  years.  Zoster vaccine. You may need this after age 19.  Pneumococcal 13-valent conjugate (PCV13) vaccine. One dose is recommended after age 68.  Pneumococcal polysaccharide (PPSV23) vaccine. One dose is recommended after age 60. Talk to your health care provider about which screenings and vaccines you need and how often you need them. This information is not intended to replace advice given to you by your health care provider. Make sure you discuss any questions you have with your health care provider. Document Released: 11/20/2015 Document Revised: 07/13/2016 Document Reviewed: 08/25/2015 Elsevier Interactive Patient Education  2017 [employer] Prevention in the Home Falls can cause injuries. They can happen to people of all ages. There are many things you can do to make your home safe and to help prevent falls. What can I do on the outside of my home?  Regularly fix the edges of walkways and driveways and fix any cracks.  Remove anything that might make you trip as you walk through a door, such as a raised step or threshold.  Trim any bushes or trees on the path to your home.  Use bright outdoor lighting.  Clear any walking paths of anything that might make someone trip, such as rocks or tools.  Regularly check to see if handrails are loose or broken. Make sure that both sides of any steps have handrails.  Any raised decks and porches should have guardrails on the edges.  Have any leaves, snow, or ice cleared regularly.  Use sand or salt on walking paths during winter.  Clean up any spills in your garage right away. This includes oil or grease spills. What can I do in the bathroom?  Use night lights.  Install grab bars by the toilet and in the tub and shower. Do not use towel bars as grab bars.  Use non-skid mats or decals in the tub or shower.  If you need to sit down in the shower, use a plastic, non-slip stool.  Keep the floor dry. Clean up any water that  spills on the floor as soon as it happens.  Remove soap buildup in the tub or shower regularly.  Attach bath mats securely with double-sided non-slip rug tape.  Do not have throw rugs and other things on the floor that can make you trip. What can I do in the bedroom?  Use night lights.  Make sure that you have a light by your bed that is easy to reach.  Do not use any sheets or blankets that are too big for your bed. They should not hang down onto the floor.  Have a firm chair that has side arms. You can use this for support while you get dressed.  Do not have throw rugs and other things on the floor that can make you trip. What can I do in the kitchen?  Clean up any spills right away.  Avoid walking on wet floors.  Keep items that you use a lot in easy-to-reach places.  If you need to reach something above you, use a strong step stool that has a grab bar.  Keep electrical cords out of the way.  Do not use floor polish or wax that makes floors slippery. If you must use wax, use non-skid floor wax.  Do not have throw rugs and  other things on the floor that can make you trip. What can I do with my stairs?  Do not leave any items on the stairs.  Make sure that there are handrails on both sides of the stairs and use them. Fix handrails that are broken or loose. Make sure that handrails are as long as the stairways.  Check any carpeting to make sure that it is firmly attached to the stairs. Fix any carpet that is loose or worn.  Avoid having throw rugs at the top or bottom of the stairs. If you do have throw rugs, attach them to the floor with carpet tape.  Make sure that you have a light switch at the top of the stairs and the bottom of the stairs. If you do not have them, ask someone to add them for you. What else can I do to help prevent falls?  Wear shoes that:  Do not have high heels.  Have rubber bottoms.  Are comfortable and fit you well.  Are closed at the  toe. Do not wear sandals.  If you use a stepladder:  Make sure that it is fully opened. Do not climb a closed stepladder.  Make sure that both sides of the stepladder are locked into place.  Ask someone to hold it for you, if possible.  Clearly mark and make sure that you can see:  Any grab bars or handrails.  First and last steps.  Where the edge of each step is.  Use tools that help you move around (mobility aids) if they are needed. These include:  Canes.  Walkers.  Scooters.  Crutches.  Turn on the lights when you go into a dark area. Replace any light bulbs as soon as they burn out.  Set up your furniture so you have a clear path. Avoid moving your furniture around.  If any of your floors are uneven, fix them.  If there are any pets around you, be aware of where they are.  Review your medicines with your doctor. Some medicines can make you feel dizzy. This can increase your chance of falling. Ask your doctor what other things that you can do to help prevent falls. This information is not intended to replace advice given to you by your health care provider. Make sure you discuss any questions you have with your health care provider. Document Released: 08/20/2009 Document Revised: 03/31/2016 Document Reviewed: 11/28/2014 Elsevier Interactive Patient Education  2017 [employer].

## 2021-02-03 NOTE — Patient Instructions (Signed)
Great seeing you today. We will call you with lab results.

## 2021-02-11 ENCOUNTER — Telehealth: Payer: Self-pay | Admitting: Dermatology

## 2021-02-11 NOTE — Telephone Encounter (Signed)
Patient is calling for a referral appointment from [facility].  Patient is scheduled for 07/21/2021 at 11:00 with Lavonna Monarch, MD.

## 2021-02-11 NOTE — Telephone Encounter (Signed)
Referral attached to appointment

## 2021-03-02 ENCOUNTER — Encounter: Payer: Self-pay | Admitting: Family Medicine

## 2021-07-21 ENCOUNTER — Other Ambulatory Visit: Payer: Self-pay

## 2021-07-21 ENCOUNTER — Ambulatory Visit (INDEPENDENT_AMBULATORY_CARE_PROVIDER_SITE_OTHER): Payer: Medicare Other | Admitting: Dermatology

## 2021-07-21 DIAGNOSIS — Z1283 Encounter for screening for malignant neoplasm of skin: Secondary | ICD-10-CM

## 2021-07-21 DIAGNOSIS — D1801 Hemangioma of skin and subcutaneous tissue: Secondary | ICD-10-CM | POA: Diagnosis not present

## 2021-07-21 DIAGNOSIS — L237 Allergic contact dermatitis due to plants, except food: Secondary | ICD-10-CM | POA: Diagnosis not present

## 2021-07-21 DIAGNOSIS — D225 Melanocytic nevi of trunk: Secondary | ICD-10-CM | POA: Diagnosis not present

## 2021-07-21 DIAGNOSIS — L570 Actinic keratosis: Secondary | ICD-10-CM

## 2021-07-21 DIAGNOSIS — I6522 Occlusion and stenosis of left carotid artery: Secondary | ICD-10-CM

## 2021-07-21 DIAGNOSIS — D2372 Other benign neoplasm of skin of left lower limb, including hip: Secondary | ICD-10-CM

## 2021-07-21 DIAGNOSIS — D239 Other benign neoplasm of skin, unspecified: Secondary | ICD-10-CM

## 2021-07-21 DIAGNOSIS — D485 Neoplasm of uncertain behavior of skin: Secondary | ICD-10-CM

## 2021-07-21 MED ORDER — CLOBETASOL PROPIONATE 0.05 % EX CREA: 1.0000 "application " | TOPICAL_CREAM | Freq: Two times a day (BID) | CUTANEOUS | 3 refills | Status: DC

## 2021-07-21 NOTE — Patient Instructions (Signed)

## 2021-07-29 ENCOUNTER — Telehealth: Payer: Self-pay

## 2021-07-29 NOTE — Telephone Encounter (Signed)
-----   Message from Lavonna Monarch, MD sent at 07/28/2021  7:07 AM EDT ----- Moderately atypical mole with clean margins.  Unless there is unusual repigmentation she can do a routine general skin examination in 1 year.

## 2021-07-29 NOTE — Telephone Encounter (Signed)
Path to patient margins are free and she has a yearly

## 2021-08-01 ENCOUNTER — Encounter: Payer: Self-pay | Admitting: Dermatology

## 2021-08-01 NOTE — Progress Notes (Signed)
   New Patient   Subjective  Connie Flores is a 65 y.o. female who presents for the following: Annual Exam (Here for annual skin exam. It has been 5 years since. No history of skin cancers. Previous derm said pre cancers on face. ).  General skin examination, history of facial actinic keratoses.  Poison ivy on right arm. Location:  Duration:  Quality:  Associated Signs/Symptoms: Modifying Factors:  Severity:  Timing: Context:    The following portions of the chart were reviewed this encounter and updated as appropriate:  Tobacco  Allergies  Meds  Problems  Med Hx  Surg Hx  Fam Hx      Objective  Well appearing patient in no apparent distress; mood and affect are within normal limits. Right Forearm - Anterior Onset 1 week, curvilinear areas of subacute dermatitis  Left Knee - Anterior 5 mm firm pink dermal papule  Mid Back Full body skin exam.  1 atypical pigmented spot right abdomen will be biopsied.  No sign nonmelanoma skin cancer.  In  Right Abdomen (side) - Lower Regular 5 mm black macule, dermoscopic atypia.       Dorsum of Nose Gritty 5 mm pink scale       A full examination was performed including scalp, head, eyes, ears, nose, lips, neck, chest, axillae, abdomen, back, buttocks, bilateral upper extremities, bilateral lower extremities, hands, feet, fingers, toes, fingernails, and toenails. All findings within normal limits unless otherwise noted below.  Areas beneath undergarments not fully examined.   Assessment & Plan  Poison ivy Right Forearm - Anterior  Topical clobetasol twice daily for 1 week.  Avoid use on face.  clobetasol cream (TEMOVATE) 0.05 % - Right Forearm - Anterior Apply 1 application topically 2 (two) times daily.  Dermatofibroma Left Knee - Anterior  Leave if stable  Screening for malignant neoplasm of skin Mid Back  Yearly skin exams.  Hemangioma of skin Mid Back  Neoplasm of uncertain behavior of skin Right  Abdomen (side) - Lower  Skin / nail biopsy Type of biopsy: tangential   Informed consent: discussed and consent obtained   Timeout: patient name, date of birth, surgical site, and procedure verified   Anesthesia: the lesion was anesthetized in a standard fashion   Anesthetic:  1% lidocaine w/ epinephrine 1-100,000 local infiltration Instrument used: flexible razor blade   Hemostasis achieved with: ferric subsulfate   Outcome: patient tolerated procedure well   Post-procedure details: wound care instructions given    Specimen 1 - Surgical pathology Differential Diagnosis: atypia  Check Margins: yes  AK (actinic keratosis) Dorsum of Nose  Destruction of lesion - Dorsum of Nose Complexity: simple   Destruction method: cryotherapy   Informed consent: discussed and consent obtained   Timeout:  patient name, date of birth, surgical site, and procedure verified Lesion destroyed using liquid nitrogen: Yes   Cryotherapy cycles:  3 Outcome: patient tolerated procedure well with no complications   Post-procedure details: wound care instructions given

## 2021-12-08 ENCOUNTER — Encounter: Payer: Self-pay | Admitting: Family Medicine

## 2022-02-09 ENCOUNTER — Encounter: Payer: Self-pay | Admitting: Family Medicine

## 2022-02-09 ENCOUNTER — Ambulatory Visit (INDEPENDENT_AMBULATORY_CARE_PROVIDER_SITE_OTHER): Payer: Medicare Other | Admitting: Family Medicine

## 2022-02-09 ENCOUNTER — Ambulatory Visit (INDEPENDENT_AMBULATORY_CARE_PROVIDER_SITE_OTHER): Payer: Medicare Other

## 2022-02-09 VITALS — BP 130/78 | HR 72 | Temp 98.1°F | Wt 179.4 lb

## 2022-02-09 VITALS — BP 124/68 | HR 64 | Temp 98.3°F | Ht 65.25 in | Wt 179.0 lb

## 2022-02-09 DIAGNOSIS — R0989 Other specified symptoms and signs involving the circulatory and respiratory systems: Secondary | ICD-10-CM

## 2022-02-09 DIAGNOSIS — Z5320 Procedure and treatment not carried out because of patient's decision for unspecified reasons: Secondary | ICD-10-CM | POA: Diagnosis not present

## 2022-02-09 DIAGNOSIS — E663 Overweight: Secondary | ICD-10-CM

## 2022-02-09 DIAGNOSIS — E782 Mixed hyperlipidemia: Secondary | ICD-10-CM

## 2022-02-09 DIAGNOSIS — Z1231 Encounter for screening mammogram for malignant neoplasm of breast: Secondary | ICD-10-CM

## 2022-02-09 DIAGNOSIS — Z0000 Encounter for general adult medical examination without abnormal findings: Secondary | ICD-10-CM

## 2022-02-09 DIAGNOSIS — I6522 Occlusion and stenosis of left carotid artery: Secondary | ICD-10-CM

## 2022-02-09 DIAGNOSIS — E2839 Other primary ovarian failure: Secondary | ICD-10-CM

## 2022-02-09 NOTE — Progress Notes (Signed)
? ? ? ?Patient ID: Connie Flores, female  DOB: [birth year], 66 y.o.   MRN: [id number] ?Patient Care Team  ?  Relationship Specialty Notifications Start End  ?Ma Hillock, DO PCP - General Family Medicine  11/29/19   ?Joseph Pierini, MD Consulting Physician Obstetrics and Gynecology  01/23/20   ?Melida Quitter, MD Consulting Physician Otolaryngology  01/23/20   ?Thornton Park, MD Consulting Physician Gastroenterology  01/31/20   ?Lavonna Monarch, MD Consulting Physician Dermatology  07/21/21   ? ? ?Chief Complaint  ?Patient presents with  ? Annual Exam  ?  Pt is fasting  ? ? ?Subjective: ?Connie Flores is a 66 y.o.  female present for [facility] prior ?All past medical history, surgical history, allergies, family history, immunizations, medications and social history were updated in the electronic medical record today. ?All recent labs, ED visits and hospitalizations within the last year were reviewed. ? ?Mixed hyperlipidemia/statin declined/overweight/carotid artery stenosis-left/bruit of left carotid artery ?Patient is fasting today.  She denies any dizziness, headaches or unilateral weakness.  She has declined statin and "all cholesterol meds .  "She reports she is taking red yeast rice 1200 mg daily ? ? ? ?  02/09/2022  ?  1:53 PM 02/03/2021  ?  9:15 AM 02/03/2021  ?  8:34 AM 01/31/2020  ?  9:04 AM 11/29/2019  ? 10:40 AM  ?Depression screen PHQ 2/9  ?Decreased Interest 0 0 0 0 0  ?Down, Depressed, Hopeless 0 0 0 0 0  ?PHQ - 2 Score 0 0 0 0 0  ? ?   ? View : No data to display.  ?  ?  ?  ? ? ?  ?  ? ?  02/09/2022  ?  1:55 PM 02/03/2021  ?  9:14 AM 02/03/2021  ?  8:35 AM 01/31/2020  ?  9:04 AM  ?Fall Risk   ?Falls in the past year? 0 0 0 0  ?Number falls in past yr: 0 0 0   ?Injury with Fall? 0 0 0   ?Risk for fall due to : Impaired vision     ?Follow up Falls prevention discussed Falls prevention discussed  Falls evaluation completed;Education provided;Falls prevention discussed  ? ? ?Immunization History  ?Administered Date(s)  Administered  ? Influenza,inj,Quad PF,6+ Mos 08/08/2019  ? Influenza-Unspecified 09/05/2020, 09/04/2021  ? Janssen (J&J) SARS-COV-2 Vaccination 01/20/2020, 11/14/2020  ? Td 07/31/2017  ? Tdap 08/07/2017  ? ? ?No results found. ? ?Past Medical History:  ?Diagnosis Date  ? Allergy   ? Chicken pox   ? Heart murmur   ? Shingles   ? ?No Known Allergies ?Past Surgical History:  ?Procedure Laterality Date  ? WISDOM TOOTH EXTRACTION    ? ?Family History  ?Problem Relation Age of Onset  ? Ovarian cancer Sister   ?     died 13-Mar-2020  ? Miscarriages / Stillbirths Sister   ? Colon polyps Sister   ? Diabetes Brother   ? Prostate cancer Brother   ? Hyperlipidemia Brother   ? Drug abuse Daughter   ? Mental illness Daughter   ? Prostate cancer Brother   ? Colon polyps Brother   ? Hyperlipidemia Brother   ? Prostate cancer Brother   ? Colon cancer Neg Hx   ? Esophageal cancer Neg Hx   ? Rectal cancer Neg Hx   ? Stomach cancer Neg Hx   ? ?Social History  ? ?Social History Narrative  ? Marital status/children/pets: Divorced.  3 children.  From [place]  2018.  ? Education/employment: Some college.  Retired  ? Safety:   ?   -smoke alarm in the home:Yes  ?   - wears seatbelt: Yes  ?   - Feels safe in their relationships: Yes  ? ? ?Allergies as of 02/09/2022   ?No Known Allergies ?  ? ?  ?Medication List  ?  ? ?  ? Accurate as of February 09, 2022 11:59 PM. If you have any questions, ask your nurse or doctor.  ?  ?  ? ?  ? ?STOP taking these medications   ? ?clobetasol cream 0.05 % ?Commonly known as: TEMOVATE ?Stopped by: Willette Brace, LPN ?  ?ezetimibe 10 MG tablet ?Commonly known as: Zetia ?Stopped by: Willette Brace, LPN ?  ? ?  ? ?TAKE these medications   ? ?b complex vitamins tablet ?Take 1 tablet by mouth daily. ?  ?Black Elderberry(Berry-Flower) 575 MG Caps ?Take 1 capsule by mouth daily. ?  ?CoQ10 100 MG Caps ?Take by mouth. Daily ?  ?LYSINE PO ?Take by mouth. PRN for cold sores ?  ?Red Yeast Rice 600 MG Tabs ?Take 1,200 mg by  mouth daily. ?  ?VITAMIN D PO ?Take by mouth. ?  ? ?  ? ? ?All past medical history, surgical history, allergies, family history, immunizations andmedications were updated in the EMR today and reviewed under the history and medication portions of their EMR.    ?Recent Results (from the past 2160 hour(s))  ?CBC     Status: None  ? Collection Time: 02/09/22  2:40 PM  ?Result Value Ref Range  ? WBC 4.1 3.8 - 10.8 Thousand/uL  ? RBC 3.98 3.80 - 5.10 Million/uL  ? Hemoglobin 12.5 11.7 - 15.5 g/dL  ? HCT 38.2 35.0 - 45.0 %  ? MCV 96.0 80.0 - 100.0 fL  ? MCH 31.4 27.0 - 33.0 pg  ? MCHC 32.7 32.0 - 36.0 g/dL  ? RDW 13.3 11.0 - 15.0 %  ? Platelets 230 140 - 400 Thousand/uL  ? MPV 10.5 7.5 - 12.5 fL  ?Comprehensive metabolic panel     Status: None  ? Collection Time: 02/09/22  2:40 PM  ?Result Value Ref Range  ? Glucose, Bld 85 65 - 99 mg/dL  ?  Comment: . ?           Fasting reference interval ?. ?  ? BUN 16 7 - 25 mg/dL  ? Creat 0.77 0.50 - 1.05 mg/dL  ? BUN/Creatinine Ratio NOT APPLICABLE 6 - 22 (calc)  ? Sodium 140 135 - 146 mmol/L  ? Potassium 3.9 3.5 - 5.3 mmol/L  ? Chloride 105 98 - 110 mmol/L  ? CO2 25 20 - 32 mmol/L  ? Calcium 9.8 8.6 - 10.4 mg/dL  ? Total Protein 6.9 6.1 - 8.1 g/dL  ? Albumin 4.5 3.6 - 5.1 g/dL  ? Globulin 2.4 1.9 - 3.7 g/dL (calc)  ? AG Ratio 1.9 1.0 - 2.5 (calc)  ? Total Bilirubin 0.6 0.2 - 1.2 mg/dL  ? Alkaline phosphatase (APISO) 47 37 - 153 U/L  ? AST 18 10 - 35 U/L  ? ALT 15 6 - 29 U/L  ?Lipid panel     Status: Abnormal  ? Collection Time: 02/09/22  2:40 PM  ?Result Value Ref Range  ? Cholesterol 215 (H) <200 mg/dL  ? HDL 64 > OR = 50 mg/dL  ? Triglycerides 83 <150 mg/dL  ? LDL Cholesterol (Calc) 133 (H) mg/dL (calc)  ?  Comment: Reference range: <100 ?Marland Kitchen ?Desirable range <100 mg/dL for primary prevention;   ?<70 mg/dL for patients with CHD or diabetic patients  ?with > or = 2 CHD risk factors. ?. ?LDL-C is now calculated using the Martin-Hopkins  ?calculation, which is a validated novel method  providing  ?better accuracy than the Friedewald equation in the  ?estimation of LDL-C.  ?Cresenciano Genre et al. Annamaria Helling. 8032;122(48): 2061-2068  ?(http://education.[facility].com/faq/FAQ164) ?  ? Total CHOL/HDL Ratio 3.4 <5.0 (calc)  ? Non-HDL Cholesterol (Calc) 151 (H) <130 mg/dL (calc)  ?  Comment: For patients with diabetes plus 1 major ASCVD risk  ?factor, treating to a non-HDL-C goal of <100 mg/dL  ?(LDL-C of <70 mg/dL) is considered a therapeutic  ?option. ?  ?TSH     Status: None  ? Collection Time: 02/09/22  2:40 PM  ?Result Value Ref Range  ? TSH 1.42 0.40 - 4.50 mIU/L  ? ? ?VAS US CAROTId ?Result Date: 01/16/2020 ?Carotid Arterial Duplex Study Indications:  Left bruit.Summary:  ?Right Carotid: Velocities in the right ICA are consistent with a 1-39% stenosis.                T ?The proximal right ICA dives posteriorly after its origin.  ?Left Carotid: Velocities in the left ICA are consistent with a 40-59% stenosis.               ?Left ICA velocities may be elevated due to vessel dive after  origin. No dicernable vessel narrowing noted.  ?Vertebrals:  Bilateral vertebral arteries demonstrate antegrade flow.  ?Subclavians: Normal flow hemodynamics were seen in bilateral subclavian              arteries. ? ? ? ? ?ROS ?14 pt review of systems performed and negative (unless mentioned in an HPI) ? ?Objective: ?BP 124/68   Pulse 64   Temp 98.3 ?F (36.8 ?C) (Oral)   Ht 5' 5.25" (1.657 m)   Wt 179 lb (81.2 kg)   SpO2 100%   BMI 29.56 kg/m?  ?Physical Exam ?Vitals and nursing note reviewed.  ?Constitutional:   ?   General: She is not in acute distress. ?   Appearance: Normal appearance. She is not ill-appearing or toxic-appearing.  ?HENT:  ?   Head: Normocephalic and atraumatic.  ?   Right Ear: Tympanic membrane, ear canal and external ear normal. There is no impacted cerumen.  ?   Left Ear: Tympanic membrane, ear canal and external ear normal. There is no impacted cerumen.  ?   Nose: No congestion or rhinorrhea.   ?   Mouth/Throat:  ?   Mouth: Mucous membranes are moist.  ?   Pharynx: Oropharynx is clear. No oropharyngeal exudate or posterior oropharyngeal erythema.  ?Eyes:  ?   General: No scleral icterus.    ?   R

## 2022-02-09 NOTE — Patient Instructions (Signed)
Health Maintenance After Age 68 After age 68, you are at a higher risk for certain long-term diseases and infections as well as injuries from falls. Falls are a major cause of broken bones and head injuries in people who are older than age 68. Getting regular preventive care can help to keep you healthy and well. Preventive care includes getting regular testing and making lifestyle changes as recommended by your health care provider. Talk with your health care provider about: Which screenings and tests you should have. A screening is a test that checks for a disease when you have no symptoms. A diet and exercise plan that is right for you. What should I know about screenings and tests to prevent falls? Screening and testing are the best ways to find a health problem early. Early diagnosis and treatment give you the best chance of managing medical conditions that are common after age 68. Certain conditions and lifestyle choices may make you more likely to have a fall. Your health care provider may recommend: Regular vision checks. Poor vision and conditions such as cataracts can make you more likely to have a fall. If you wear glasses, make sure to get your prescription updated if your vision changes. Medicine review. Work with your health care provider to regularly review all of the medicines you are taking, including over-the-counter medicines. Ask your health care provider about any side effects that may make you more likely to have a fall. Tell your health care provider if any medicines that you take make you feel dizzy or sleepy. Strength and balance checks. Your health care provider may recommend certain tests to check your strength and balance while standing, walking, or changing positions. Foot health exam. Foot pain and numbness, as well as not wearing proper footwear, can make you more likely to have a fall. Screenings, including: Osteoporosis screening. Osteoporosis is a condition that causes  the bones to get weaker and break more easily. Blood pressure screening. Blood pressure changes and medicines to control blood pressure can make you feel dizzy. Depression screening. You may be more likely to have a fall if you have a fear of falling, feel depressed, or feel unable to do activities that you used to do. Alcohol use screening. Using too much alcohol can affect your balance and may make you more likely to have a fall. Follow these instructions at home: Lifestyle Do not drink alcohol if: Your health care provider tells you not to drink. If you drink alcohol: Limit how much you have to: 0-1 drink a day for women. 0-2 drinks a day for men. Know how much alcohol is in your drink. In the [place] one drink equals one 12 oz bottle of beer (355 mL), one 5 oz glass of wine (148 mL), or one 1 oz glass of hard liquor (44 mL). Do not use any products that contain nicotine or tobacco. These products include cigarettes, chewing tobacco, and vaping devices, such as e-cigarettes. If you need help quitting, ask your health care provider. Activity  Follow a regular exercise program to stay fit. This will help you maintain your balance. Ask your health care provider what types of exercise are appropriate for you. If you need a cane or walker, use it as recommended by your health care provider. Wear supportive shoes that have nonskid soles. Safety  Remove any tripping hazards, such as rugs, cords, and clutter. Install safety equipment such as grab bars in bathrooms and safety rails on stairs. Keep rooms and walkways   well-lit. General instructions Talk with your health care provider about your risks for falling. Tell your health care provider if: You fall. Be sure to tell your health care provider about all falls, even ones that seem minor. You feel dizzy, tiredness (fatigue), or off-balance. Take over-the-counter and prescription medicines only as told by your health care provider. These include  supplements. Eat a healthy diet and maintain a healthy weight. A healthy diet includes low-fat dairy products, low-fat (lean) meats, and fiber from whole grains, beans, and lots of fruits and vegetables. Stay current with your vaccines. Schedule regular health, dental, and eye exams. Summary Having a healthy lifestyle and getting preventive care can help to protect your health and wellness after age 68. Screening and testing are the best way to find a health problem early and help you avoid having a fall. Early diagnosis and treatment give you the best chance for managing medical conditions that are more common for people who are older than age 68. Falls are a major cause of broken bones and head injuries in people who are older than age 68. Take precautions to prevent a fall at home. Work with your health care provider to learn what changes you can make to improve your health and wellness and to prevent falls. This information is not intended to replace advice given to you by your health care provider. Make sure you discuss any questions you have with your health care provider. Document Revised: 03/15/2021 Document Reviewed: 03/15/2021 Elsevier Patient Education  2022 Elsevier Inc.  

## 2022-02-09 NOTE — Patient Instructions (Signed)
Ms. Maestre , ?Thank you for taking time to come for your Medicare Wellness Visit. I appreciate your ongoing commitment to your health goals. Please review the following plan we discussed and let me know if I can assist you in the future.  ? ?Screening recommendations/referrals: ?Colonoscopy: Done 12/18/19 repeat every 5 years  ?Mammogram: Pt stated she will call previous GYN  ?Bone Density: Done 01/28/20 repeat every 2 years  ?Recommended yearly ophthalmology/optometry visit for glaucoma screening and checkup ?Recommended yearly dental visit for hygiene and checkup ? ?Vaccinations: ?Influenza vaccine: Done 09/04/21 repeat every year  ?Pneumococcal vaccine: Due ?Tdap vaccine: Done 08/07/17 repeat every 0 years  ?Shingles vaccine: Shingrix discussed. Please contact your pharmacy for coverage information.   ?Covid-19:Completed 01/20/20 & 11/14/20 ? ?Advanced directives: Advance directive discussed with you today. I have provided a copy for you to complete at home and have notarized. Once this is complete please bring a copy in to our office so we can scan it into your chart. ? ?Conditions/risks identified: lose weight  ? ?Next appointment: Follow up in one year for your annual wellness visit  ? ? ?Preventive Care 68 Years and Older, Female ?Preventive care refers to lifestyle choices and visits with your health care provider that can promote health and wellness. ?What does preventive care include? ?A yearly physical exam. This is also called an annual well check. ?Dental exams once or twice a year. ?Routine eye exams. Ask your health care provider how often you should have your eyes checked. ?Personal lifestyle choices, including: ?Daily care of your teeth and gums. ?Regular physical activity. ?Eating a healthy diet. ?Avoiding tobacco and drug use. ?Limiting alcohol use. ?Practicing safe sex. ?Taking low-dose aspirin every day. ?Taking vitamin and mineral supplements as recommended by your health care provider. ?What  happens during an annual well check? ?The services and screenings done by your health care provider during your annual well check will depend on your age, overall health, lifestyle risk factors, and family history of disease. ?Counseling  ?Your health care provider may ask you questions about your: ?Alcohol use. ?Tobacco use. ?Drug use. ?Emotional well-being. ?Home and relationship well-being. ?Sexual activity. ?Eating habits. ?History of falls. ?Memory and ability to understand (cognition). ?Work and work Statistician. ?Reproductive health. ?Screening  ?You may have the following tests or measurements: ?Height, weight, and BMI. ?Blood pressure. ?Lipid and cholesterol levels. These may be checked every 5 years, or more frequently if you are over 30 years old. ?Skin check. ?Lung cancer screening. You may have this screening every year starting at age 71 if you have a 30-pack-year history of smoking and currently smoke or have quit within the past 15 years. ?Fecal occult blood test (FOBT) of the stool. You may have this test every year starting at age 31. ?Flexible sigmoidoscopy or colonoscopy. You may have a sigmoidoscopy every 5 years or a colonoscopy every 10 years starting at age 41. ?Hepatitis C blood test. ?Hepatitis B blood test. ?Sexually transmitted disease (STD) testing. ?Diabetes screening. This is done by checking your blood sugar (glucose) after you have not eaten for a while (fasting). You may have this done every 1-3 years. ?Bone density scan. This is done to screen for osteoporosis. You may have this done starting at age 52. ?Mammogram. This may be done every 1-2 years. Talk to your health care provider about how often you should have regular mammograms. ?Talk with your health care provider about your test results, treatment options, and if necessary, the need for  more tests. ?Vaccines  ?Your health care provider may recommend certain vaccines, such as: ?Influenza vaccine. This is recommended every  year. ?Tetanus, diphtheria, and acellular pertussis (Tdap, Td) vaccine. You may need a Td booster every 10 years. ?Zoster vaccine. You may need this after age 19. ?Pneumococcal 13-valent conjugate (PCV13) vaccine. One dose is recommended after age 34. ?Pneumococcal polysaccharide (PPSV23) vaccine. One dose is recommended after age 77. ?Talk to your health care provider about which screenings and vaccines you need and how often you need them. ?This information is not intended to replace advice given to you by your health care provider. Make sure you discuss any questions you have with your health care provider. ?Document Released: 11/20/2015 Document Revised: 07/13/2016 Document Reviewed: 08/25/2015 ?Elsevier Interactive Patient Education ? 2017 [employer]. ? ?Fall Prevention in the Home ?Falls can cause injuries. They can happen to people of all ages. There are many things you can do to make your home safe and to help prevent falls. ?What can I do on the outside of my home? ?Regularly fix the edges of walkways and driveways and fix any cracks. ?Remove anything that might make you trip as you walk through a door, such as a raised step or threshold. ?Trim any bushes or trees on the path to your home. ?Use bright outdoor lighting. ?Clear any walking paths of anything that might make someone trip, such as rocks or tools. ?Regularly check to see if handrails are loose or broken. Make sure that both sides of any steps have handrails. ?Any raised decks and porches should have guardrails on the edges. ?Have any leaves, snow, or ice cleared regularly. ?Use sand or salt on walking paths during winter. ?Clean up any spills in your garage right away. This includes oil or grease spills. ?What can I do in the bathroom? ?Use night lights. ?Install grab bars by the toilet and in the tub and shower. Do not use towel bars as grab bars. ?Use non-skid mats or decals in the tub or shower. ?If you need to sit down in the shower, use a  plastic, non-slip stool. ?Keep the floor dry. Clean up any water that spills on the floor as soon as it happens. ?Remove soap buildup in the tub or shower regularly. ?Attach bath mats securely with double-sided non-slip rug tape. ?Do not have throw rugs and other things on the floor that can make you trip. ?What can I do in the bedroom? ?Use night lights. ?Make sure that you have a light by your bed that is easy to reach. ?Do not use any sheets or blankets that are too big for your bed. They should not hang down onto the floor. ?Have a firm chair that has side arms. You can use this for support while you get dressed. ?Do not have throw rugs and other things on the floor that can make you trip. ?What can I do in the kitchen? ?Clean up any spills right away. ?Avoid walking on wet floors. ?Keep items that you use a lot in easy-to-reach places. ?If you need to reach something above you, use a strong step stool that has a grab bar. ?Keep electrical cords out of the way. ?Do not use floor polish or wax that makes floors slippery. If you must use wax, use non-skid floor wax. ?Do not have throw rugs and other things on the floor that can make you trip. ?What can I do with my stairs? ?Do not leave any items on the stairs. ?Make  sure that there are handrails on both sides of the stairs and use them. Fix handrails that are broken or loose. Make sure that handrails are as long as the stairways. ?Check any carpeting to make sure that it is firmly attached to the stairs. Fix any carpet that is loose or worn. ?Avoid having throw rugs at the top or bottom of the stairs. If you do have throw rugs, attach them to the floor with carpet tape. ?Make sure that you have a light switch at the top of the stairs and the bottom of the stairs. If you do not have them, ask someone to add them for you. ?What else can I do to help prevent falls? ?Wear shoes that: ?Do not have high heels. ?Have rubber bottoms. ?Are comfortable and fit you  well. ?Are closed at the toe. Do not wear sandals. ?If you use a stepladder: ?Make sure that it is fully opened. Do not climb a closed stepladder. ?Make sure that both sides of the stepladder are locked into place. ?A

## 2022-02-09 NOTE — Progress Notes (Signed)
? ?Subjective:  ? [place] is a 66 y.o. female who presents for Medicare Annual (Subsequent) preventive examination. ? ?Review of Systems    ? ?Cardiac Risk Factors include: advanced age (>67mn, >>65women);dyslipidemia ? ?   ?Objective:  ?  ?Today's Vitals  ? 02/09/22 1349  ?BP: 130/78  ?Pulse: 72  ?Temp: 98.1 ?F (36.7 ?C)  ?SpO2: 96%  ?Weight: 179 lb 6.4 oz (81.4 kg)  ? ?Body mass index is 28.96 kg/m?. ? ? ?  02/09/2022  ?  1:54 PM 02/03/2021  ?  9:11 AM 01/31/2020  ?  9:00 AM 12/18/2019  ?  8:16 AM  ?Advanced Directives  ?Does Patient Have a Medical Advance Directive? No Yes No No  ?Type of ACorporate treasurerof Attorney    ?Copy of HNicein Chart?  No - copy requested    ?Would patient like information on creating a medical advance directive? Yes (MAU/Ambulatory/Procedural Areas - Information given)  Yes (ED - Information included in AVS) No - Patient declined  ? ? ?Current Medications (verified) ?Outpatient Encounter Medications as of 02/09/2022  ?Medication Sig  ? b complex vitamins tablet Take 1 tablet by mouth daily.  ? Black Elderberry,Berry-Flower, 575 MG CAPS Take 1 capsule by mouth daily.  ? Coenzyme Q10 (COQ10) 100 MG CAPS Take by mouth. Daily  ? LYSINE PO Take by mouth. PRN for cold sores  ? Red Yeast Rice 600 MG TABS Take 1,200 mg by mouth daily.  ? VITAMIN D PO Take by mouth.  ? [DISCONTINUED] clobetasol cream (TEMOVATE) 01.32% Apply 1 application topically 2 (two) times daily.  ? [DISCONTINUED] ezetimibe (ZETIA) 10 MG tablet Take 1 tablet (10 mg total) by mouth daily. (Patient not taking: Reported on 07/21/2021)  ? ?No facility-administered encounter medications on file as of 02/09/2022.  ? ? ?Allergies (verified) ?Patient has no known allergies.  ? ?History: ?Past Medical History:  ?Diagnosis Date  ? Allergy   ? Chicken pox   ? Heart murmur   ? Shingles   ? ?Past Surgical History:  ?Procedure Laterality Date  ? WISDOM TOOTH EXTRACTION    ? ?Family History   ?Problem Relation Age of Onset  ? Ovarian cancer Sister   ?     died 531-May-2021 ? Miscarriages / Stillbirths Sister   ? Colon polyps Sister   ? Diabetes Brother   ? Prostate cancer Brother   ? Hyperlipidemia Brother   ? Drug abuse Daughter   ? Mental illness Daughter   ? Prostate cancer Brother   ? Colon polyps Brother   ? Hyperlipidemia Brother   ? Prostate cancer Brother   ? Colon cancer Neg Hx   ? Esophageal cancer Neg Hx   ? Rectal cancer Neg Hx   ? Stomach cancer Neg Hx   ? ?Social History  ? ?Socioeconomic History  ? Marital status: Single  ?  Spouse name: Not on file  ? Number of children: Not on file  ? Years of education: Not on file  ? Highest education level: Not on file  ?Occupational History  ? Occupation: retired  ?Tobacco Use  ? Smoking status: Former  ?  Packs/day: 1.00  ?  Years: 3.00  ?  Pack years: 3.00  ?  Types: Cigarettes  ? Smokeless tobacco: Never  ? Tobacco comments:  ?  quit in the 70's   ?Vaping Use  ? Vaping Use: Never used  ?Substance and Sexual Activity  ?  Alcohol use: Yes  ?  Alcohol/week: 4.0 standard drinks  ?  Types: 4 Glasses of wine per week  ? Drug use: Never  ? Sexual activity: Not Currently  ?  Comment: 1st intercourse 31 yo-5 partners  ?Other Topics Concern  ? Not on file  ?Social History Narrative  ? Marital status/children/pets: Divorced.  3 children.  From [place] 2018.  ? Education/employment: Some college.  Retired  ? Safety:   ?   -smoke alarm in the home:Yes  ?   - wears seatbelt: Yes  ?   - Feels safe in their relationships: Yes  ? ?Social Determinants of Health  ? ?Financial Resource Strain: Low Risk   ? Difficulty of Paying Living Expenses: Not hard at all  ?Food Insecurity: No Food Insecurity  ? Worried About Charity fundraiser in the Last Year: Never true  ? Ran Out of Food in the Last Year: Never true  ?Transportation Needs: No Transportation Needs  ? Lack of Transportation (Medical): No  ? Lack of Transportation (Non-Medical): No  ?Physical Activity: Inactive   ? Days of Exercise per Week: 0 days  ? Minutes of Exercise per Session: 0 min  ?Stress: No Stress Concern Present  ? Feeling of Stress : Not at all  ?Social Connections: Moderately Integrated  ? Frequency of Communication with Friends and Family: More than three times a week  ? Frequency of Social Gatherings with Friends and Family: More than three times a week  ? Attends Religious Services: More than 4 times per year  ? Active Member of Clubs or Organizations: Yes  ? Attends Archivist Meetings: 1 to 4 times per year  ? Marital Status: Divorced  ? ? ?Tobacco Counseling ?Counseling given: Not Answered ?Tobacco comments: quit in the 70's  ? ? ?Clinical Intake: ? ?Pre-visit preparation completed: Yes ? ?Pain : No/denies pain ? ?  ? ?Nutritional Status: BMI 25 -29 Overweight ?Nutritional Risks: None ?Diabetes: No ? ?How often do you need to have someone help you when you read instructions, pamphlets, or other written materials from your doctor or pharmacy?: 1 - Never ? ?Diabetic?no ? ?Interpreter Needed?: No ? ?Information entered by :: Charlott Rakes, LPN ? ? ?Activities of Daily Living ? ?  02/09/2022  ?  1:56 PM  ?In your present state of health, do you have any difficulty performing the following activities:  ?Hearing? 0  ?Vision? 0  ?Difficulty concentrating or making decisions? 0  ?Walking or climbing stairs? 0  ?Dressing or bathing? 0  ?Doing errands, shopping? 0  ?Preparing Food and eating ? N  ?Using the Toilet? N  ?In the past six months, have you accidently leaked urine? N  ?Do you have problems with loss of bowel control? N  ?Managing your Medications? N  ?Managing your Finances? N  ?Housekeeping or managing your Housekeeping? N  ? ? ?Patient Care Team: ?Ma Hillock, DO as PCP - General (Family Medicine) ?Joseph Pierini, MD as Consulting Physician (Obstetrics and Gynecology) ?Melida Quitter, MD as Consulting Physician (Otolaryngology) ?Thornton Park, MD as Consulting Physician  (Gastroenterology) ?Lavonna Monarch, MD as Consulting Physician (Dermatology) ? ?Indicate any recent Medical Services you may have received from other than [facility] providers in the past year (date may be approximate). ? ?   ?Assessment:  ? This is a routine wellness examination for Nillie. ? ?Hearing/Vision screen ?Hearing Screening - Comments:: Pt denies any hearing issues  ?Vision Screening - Comments:: Pt follows up with provider  [place] [facility] for annual eye exams  ? ?Dietary issues and exercise activities discussed: ?Current Exercise Habits: The patient does not participate in regular exercise at present ? ? Goals Addressed   ? ?  ?  ?  ?  ? This Visit's Progress  ?  Patient Stated     ?  Lose weight  ?  ? ?  ? ?Depression Screen ? ?  02/09/2022  ?  1:53 PM 02/03/2021  ?  9:15 AM 02/03/2021  ?  8:34 AM 01/31/2020  ?  9:04 AM 11/29/2019  ? 10:40 AM  ?PHQ 2/9 Scores  ?PHQ - 2 Score 0 0 0 0 0  ?  ?Fall Risk ? ?  02/09/2022  ?  1:55 PM 02/03/2021  ?  9:14 AM 02/03/2021  ?  8:35 AM 01/31/2020  ?  9:04 AM  ?Fall Risk   ?Falls in the past year? 0 0 0 0  ?Number falls in past yr: 0 0 0   ?Injury with Fall? 0 0 0   ?Risk for fall due to : Impaired vision     ?Follow up Falls prevention discussed Falls prevention discussed  Falls evaluation completed;Education provided;Falls prevention discussed  ? ? ?FALL RISK PREVENTION PERTAINING TO THE HOME: ? ?Any stairs in or around the home? Yes  ?If so, are there any without handrails? No  ?Home free of loose throw rugs in walkways, pet beds, electrical cords, etc? Yes  ?Adequate lighting in your home to reduce risk of falls? Yes  ? ?ASSISTIVE DEVICES UTILIZED TO PREVENT FALLS: ? ?Life alert? No  ?Use of a cane, walker or w/c? No  ?Grab bars in the bathroom? No  ?Shower chair or bench in shower? No  ?Elevated toilet seat or a handicapped toilet? No  ? ?TIMED UP AND GO: ? ?Was the test performed? Yes .  ?Length of time to ambulate 10 feet: 10 sec.  ? ?Gait steady and fast without use of assistive  device ? ?Cognitive Function: ?  ?  ? ?  02/09/2022  ?  1:57 PM  ?6CIT Screen  ?What Year? 0 points  ?What month? 0 points  ?What time? 0 points  ?Count back from 20 0 points  ?Months in reverse 0 points  ?Repeat phrase 0 poi

## 2022-02-10 ENCOUNTER — Encounter: Payer: Self-pay | Admitting: Family Medicine

## 2022-02-10 LAB — COMPREHENSIVE METABOLIC PANEL
AG Ratio: 1.9 (calc) (ref 1.0–2.5)
ALT: 15 U/L (ref 6–29)
AST: 18 U/L (ref 10–35)
Albumin: 4.5 g/dL (ref 3.6–5.1)
Alkaline phosphatase (APISO): 47 U/L (ref 37–153)
BUN: 16 mg/dL (ref 7–25)
CO2: 25 mmol/L (ref 20–32)
Calcium: 9.8 mg/dL (ref 8.6–10.4)
Chloride: 105 mmol/L (ref 98–110)
Creat: 0.77 mg/dL (ref 0.50–1.05)
Globulin: 2.4 g/dL (calc) (ref 1.9–3.7)
Glucose, Bld: 85 mg/dL (ref 65–99)
Potassium: 3.9 mmol/L (ref 3.5–5.3)
Sodium: 140 mmol/L (ref 135–146)
Total Bilirubin: 0.6 mg/dL (ref 0.2–1.2)
Total Protein: 6.9 g/dL (ref 6.1–8.1)

## 2022-02-10 LAB — TSH: TSH: 1.42 mIU/L (ref 0.40–4.50)

## 2022-02-10 LAB — CBC
HCT: 38.2 % (ref 35.0–45.0)
Hemoglobin: 12.5 g/dL (ref 11.7–15.5)
MCH: 31.4 pg (ref 27.0–33.0)
MCHC: 32.7 g/dL (ref 32.0–36.0)
MCV: 96 fL (ref 80.0–100.0)
MPV: 10.5 fL (ref 7.5–12.5)
Platelets: 230 10*3/uL (ref 140–400)
RBC: 3.98 10*6/uL (ref 3.80–5.10)
RDW: 13.3 % (ref 11.0–15.0)
WBC: 4.1 10*3/uL (ref 3.8–10.8)

## 2022-02-10 LAB — LIPID PANEL
Cholesterol: 215 mg/dL — ABNORMAL HIGH (ref <200)
HDL: 64 mg/dL (ref >=50)
LDL Cholesterol (Calc): 133 mg/dL (calc) — ABNORMAL HIGH
Non-HDL Cholesterol (Calc): 151 mg/dL (calc) — ABNORMAL HIGH (ref <130)
Total CHOL/HDL Ratio: 3.4 (calc) (ref <5.0)
Triglycerides: 83 mg/dL (ref <150)

## 2022-03-01 ENCOUNTER — Telehealth: Payer: Self-pay

## 2022-03-01 NOTE — Telephone Encounter (Signed)
Patient called inquiring about BD test. I advised her that her last BD test in 01/26/20 indicated repeat test in 5 years. ? ?Patient is past due for annual gyn exam. It has been > 2 years.  SHe wants to go ahead and schedule that so message sent to appt desk to call her to arrange. She will speak with the provider at visit about BD test recommendation. ?

## 2022-03-02 ENCOUNTER — Other Ambulatory Visit: Payer: Self-pay | Admitting: Family Medicine

## 2022-03-02 DIAGNOSIS — Z1231 Encounter for screening mammogram for malignant neoplasm of breast: Secondary | ICD-10-CM

## 2022-03-08 ENCOUNTER — Ambulatory Visit
Admission: RE | Admit: 2022-03-08 | Discharge: 2022-03-08 | Disposition: A | Discharge status: Home / Self Care | Payer: Medicare Other | Source: Ambulatory Visit | Attending: Family Medicine | Admitting: Family Medicine

## 2022-03-08 DIAGNOSIS — Z1231 Encounter for screening mammogram for malignant neoplasm of breast: Secondary | ICD-10-CM

## 2022-03-14 NOTE — Progress Notes (Signed)
66 y.o. G3P3 Single Caucasian female here for annual breast and pelvic exam.   ? ?No GYN concerns. ? ?Sister died of ovarian cancer after her last visit here in 29-Jan-2020.  ?Paternal cousin also died of ovarian cancer.  ?Three brothers with prostate cancer.  ?Maternal cousin with prostate cancer.  ? ?PCP:  Howard Pouch, DO  ? ?No LMP recorded. Patient is postmenopausal.     ?  ?    ?Sexually active: No.  ?The current method of family planning is post menopausal status.    ?Exercising: No.  The patient does not participate in regular exercise at present. Walks dog daily, and up and down stairs at home ?Smoker:  Former ? ?Health Maintenance: ?Pap:  12-11-19 Neg ?History of abnormal Pap:  no ?MMG:  03-08-22 Neg/Birads1 ?Colonoscopy:  12-18-19 normal;next 5 years ?BMD: 01-28-20  Result :Normal ?TDaP:  08-07-17 ?Gardasil:   n/a ?HIV: donated blood in past ?Hep C: 01-31-20 Neg ?Screening Labs:  PCP ? ? reports that she has quit smoking. Her smoking use included cigarettes. She has a 3.00 pack-year smoking history. She has never used smokeless tobacco. She reports current alcohol use of about 4.0 standard drinks per week. She reports that she does not use drugs. ? ?Past Medical History:  ?Diagnosis Date  ? Allergy   ? CAD (coronary artery disease) 11/08/2019  ? Chicken pox   ? Heart murmur   ? Shingles   ? ? ?Past Surgical History:  ?Procedure Laterality Date  ? WISDOM TOOTH EXTRACTION    ? ? ?Current Outpatient Medications  ?Medication Sig Dispense Refill  ? b complex vitamins tablet Take 1 tablet by mouth daily.    ? Black Elderberry,Berry-Flower, 575 MG CAPS Take 1 capsule by mouth daily.    ? Coenzyme Q10 (COQ10) 100 MG CAPS Take by mouth. Daily    ? LYSINE PO Take by mouth. PRN for cold sores    ? Red Yeast Rice 600 MG TABS Take 1,200 mg by mouth daily.    ? VITAMIN D PO Take by mouth.    ? ?No current facility-administered medications for this visit.  ? ? ?Family History  ?Problem Relation Age of Onset  ? Ovarian cancer Sister    ?     died 2020/03/18  ? Miscarriages / Stillbirths Sister   ? Colon polyps Sister   ? Diabetes Brother   ? Prostate cancer Brother   ? Hyperlipidemia Brother   ? Drug abuse Daughter   ? Mental illness Daughter   ? Prostate cancer Brother   ? Colon polyps Brother   ? Hyperlipidemia Brother   ? Prostate cancer Brother   ? Colon cancer Neg Hx   ? Esophageal cancer Neg Hx   ? Rectal cancer Neg Hx   ? Stomach cancer Neg Hx   ? ? ?Review of Systems  ?All other systems reviewed and are negative. ? ?Exam:   ?BP 122/68   Pulse 72   Ht '5\' 5"'$  (1.651 m)   Wt 181 lb (82.1 kg)   SpO2 99%   BMI 30.12 kg/m?     ?General appearance: alert, cooperative and appears stated age ?Head: normocephalic, without obvious abnormality, atraumatic ?Neck: no adenopathy, supple, symmetrical, trachea midline and thyroid normal to inspection and palpation ?Lungs: clear to auscultation bilaterally ?Breasts: normal appearance, no masses or tenderness, No nipple retraction or dimpling, No nipple discharge or bleeding, No axillary adenopathy ?Heart: regular rate and rhythm ?Abdomen: soft, non-tender; no masses, no organomegaly ?Extremities:  extremities normal, atraumatic, no cyanosis or edema ?Skin: skin color, texture, turgor normal. No rashes or lesions ?Lymph nodes: cervical, supraclavicular, and axillary nodes normal. ?Neurologic: grossly normal ? ?Pelvic: External genitalia:  no lesions ?             No abnormal inguinal nodes palpated. ?             Urethra:  normal appearing urethra with no masses, tenderness or lesions ?             Bartholins and Skenes: normal    ?             Vagina: normal appearing vagina with normal color and discharge, no lesions ?             Cervix: no lesions ?             Pap taken:  yes ?Bimanual Exam:  Uterus:  normal size, contour, position, consistency, mobility, non-tender ?             Adnexa: no mass, fullness, tenderness ?             Rectal exam: yes.  Confirms. ?             Anus:  normal sphincter  tone, no lesions ? ?Chaperone was present for exam:  Estill Bamberg, CMA ? ?Assessment:   ?Well woman visit with gynecologic exam. ?FH ovarian cancer in sister and prostate cancer in 3 brothers.  ?Health education encounter.  ? ?Plan: ?Mammogram screening discussed. ?Self breast awareness reviewed. ?Pap and HR HPV collected. ?Guidelines for Calcium, Vitamin D, regular exercise program including cardiovascular and weight bearing exercise. ?We discussed genetic counseling and testing.  She will consider.  ?I offered pelvic ultrasound and CA125 testing.   She will consider.  ?We dicussed signs and symptoms or ovarian cancer. ?Follow up annually and prn.  ? ?After visit summary provided.  ? ?28 min  total time was spent for this patient encounter, including preparation, face-to-face counseling with the patient, coordination of care, and documentation of the encounter. ? ? ? ? ?

## 2022-03-15 ENCOUNTER — Encounter: Payer: Self-pay | Admitting: Obstetrics and Gynecology

## 2022-03-15 ENCOUNTER — Other Ambulatory Visit (HOSPITAL_COMMUNITY)
Admission: RE | Admit: 2022-03-15 | Discharge: 2022-03-15 | Disposition: A | Discharge status: Home / Self Care | Payer: Medicare Other | Source: Ambulatory Visit | Attending: Obstetrics and Gynecology | Admitting: Obstetrics and Gynecology

## 2022-03-15 ENCOUNTER — Ambulatory Visit (INDEPENDENT_AMBULATORY_CARE_PROVIDER_SITE_OTHER): Payer: Medicare Other | Admitting: Obstetrics and Gynecology

## 2022-03-15 VITALS — BP 122/68 | HR 72 | Ht 65.0 in | Wt 181.0 lb

## 2022-03-15 DIAGNOSIS — Z124 Encounter for screening for malignant neoplasm of cervix: Secondary | ICD-10-CM | POA: Diagnosis not present

## 2022-03-15 DIAGNOSIS — Z8042 Family history of malignant neoplasm of prostate: Secondary | ICD-10-CM | POA: Diagnosis not present

## 2022-03-15 DIAGNOSIS — Z01419 Encounter for gynecological examination (general) (routine) without abnormal findings: Secondary | ICD-10-CM

## 2022-03-15 DIAGNOSIS — Z8041 Family history of malignant neoplasm of ovary: Secondary | ICD-10-CM

## 2022-03-15 DIAGNOSIS — Z1151 Encounter for screening for human papillomavirus (HPV): Secondary | ICD-10-CM | POA: Insufficient documentation

## 2022-03-15 DIAGNOSIS — I6522 Occlusion and stenosis of left carotid artery: Secondary | ICD-10-CM

## 2022-03-15 DIAGNOSIS — Z719 Counseling, unspecified: Secondary | ICD-10-CM | POA: Diagnosis not present

## 2022-03-15 NOTE — Patient Instructions (Signed)
EXERCISE AND DIET:  We recommended that you start or continue a regular exercise program for good health. Regular exercise means any activity that makes your heart beat faster and makes you sweat.  We recommend exercising at least 30 minutes per day at least 3 days a week, preferably 4 or 5.  We also recommend a diet low in fat and sugar.  Inactivity, poor dietary choices and obesity can cause diabetes, heart attack, stroke, and kidney damage, among others.   ? ?ALCOHOL AND SMOKING:  Women should limit their alcohol intake to no more than 7 drinks/beers/glasses of wine (combined, not each!) per week. Moderation of alcohol intake to this level decreases your risk of breast cancer and liver damage. And of course, no recreational drugs are part of a healthy lifestyle.  And absolutely no smoking or even second hand smoke. Most people know smoking can cause heart and lung diseases, but did you know it also contributes to weakening of your bones? Aging of your skin?  Yellowing of your teeth and nails? ? ?CALCIUM AND VITAMIN D:  Adequate intake of calcium and Vitamin D are recommended.  The recommendations for exact amounts of these supplements seem to change often, but generally speaking 600 mg of calcium (either carbonate or citrate) and 800 units of Vitamin D per day seems prudent. Certain women may benefit from higher intake of Vitamin D.  If you are among these women, your doctor will have told you during your visit.   ? ?PAP SMEARS:  Pap smears, to check for cervical cancer or precancers,  have traditionally been done yearly, although recent scientific advances have shown that most women can have pap smears less often.  However, every woman still should have a physical exam from her gynecologist every year. It will include a breast check, inspection of the vulva and vagina to check for abnormal growths or skin changes, a visual exam of the cervix, and then an exam to evaluate the size and shape of the uterus and  ovaries.  And after 68 years of age, a rectal exam is indicated to check for rectal cancers. We will also provide age appropriate advice regarding health maintenance, like when you should have certain vaccines, screening for sexually transmitted diseases, bone density testing, colonoscopy, mammograms, etc.  ? ?MAMMOGRAMS:  All women over 68 years old should have a yearly mammogram. Many facilities now offer a "3D" mammogram, which may cost around $50 extra out of pocket. If possible,  we recommend you accept the option to have the 3D mammogram performed.  It both reduces the number of women who will be called back for extra views which then turn out to be normal, and it is better than the routine mammogram at detecting truly abnormal areas.   ? ?COLONOSCOPY:  Colonoscopy to screen for colon cancer is recommended for all women at age 68.  We know, you hate the idea of the prep.  We agree, BUT, having colon cancer and not knowing it is worse!!  Colon cancer so often starts as a polyp that can be seen and removed at colonscopy, which can quite literally save your life!  And if your first colonoscopy is normal and you have no family history of colon cancer, most women don't have to have it again for 10 years.  Once every ten years, you can do something that may end up saving your life, right?  We will be happy to help you get it scheduled when you are ready.  Be sure to check your insurance coverage so you understand how much it will cost.  It may be covered as a preventative service at no cost, but you should check your particular policy.   ? ? ? ?BRCA Gene Test ?Why am I having this test? ?A BRCA gene (BReast CAncer gene) test is done to check for the presence of harmful changes (mutations) in the BRCA1 gene or the BRCA2 gene. Both of these genes are called breast cancer susceptibility genes. If there is a mutation, the genes may not be able to help repair damaged cells in the body. As a result, the damaged cells may  develop defects that can lead to certain types of cancer. ?You may have this test if you have a family history of certain types of cancer, including cancer of the breast, ovaries, fallopian tubes, peritoneum, pancreas, or prostate. ?What is being tested? ?This test checks the BRCA1 and BRCA2 genes for mutations. ?What kind of sample is taken? ? ?  ? ?The test requires either a sample of blood or a sample of cells from your saliva. ?If a sample of blood is needed, it will be collected by inserting a needle into a vein. ?If a sample of saliva is needed, you will be given instructions on how to collect the sample. ?How are the results reported? ?Your test results will be reported as negative, positive, or less commonly as a variant of unknown significance (VUS). ?What do the results mean? ?The test results can show whether you have a mutation in the BRCA1 or BRCA2 gene that increases your risk for certain cancers, or if you have a mutation in the BRCA1 or BRCA2 gene that is not associated with an increased risk of cancer at this time. ?A negative test result means that you do not have a mutation in the BRCA1 or BRCA2 gene that is known to increase your risk for certain cancers. However, it is still possible to test positive for cancer in the future. ?A positive test result means that you have a mutation in the BRCA1 or BRCA2 gene that increases your risk for certain cancers. ?A positive test result means you have an increased risk for breast and ovarian cancer. Both women and men with a mutation have an increased risk for breast cancer and may be at greater risk for other types of cancer. However, getting a positive test result does not mean that you will develop cancer. ?A BRCA1 or BRCA2 gene mutation means that you have a risk of passing it along to your children. ?A variant of unknown significance (VUS) means there is a change in the BRCA1 or BRCA2 gene, but it is a change that has not been linked to cancer. As more  research is done and more is learned about these mutations, they are often reclassified as positive, with a link to a particular cancer or cancers, or negative, with no link to a cancer or cancers. ?Talk with your health care provider or genetic counselor about what your results mean. In some cases, your health care provider may do more testing to confirm the results. ?Questions to ask your health care provider ?Ask your health care provider, or the department that is doing the test: ?When will my results be ready? ?How will I get my results? ?What are my treatment options? ?What other tests do I need? ?What are my next steps? ?Summary ?BRCA gene (BReast CAncer gene) testing is done to check for the presence of harmful changes (  mutations) in the BRCA1 gene or the BRCA2 gene. ?The BRCA1 and BRCA2 genes are called breast cancer susceptibility genes. ?You may have this test if you have a family history of certain types of cancer, including cancer of the breast, ovaries, fallopian tubes, peritoneum, pancreas, or prostate. ?The test requires either a sample of blood or a sample of cells from your saliva. ?Your test results will be reported as negative, positive, or as a variant of unknown significance. Talk with your health care provider or genetic counselor about what your results mean. ?This information is not intended to replace advice given to you by your health care provider. Make sure you discuss any questions you have with your health care provider. ?Document Revised: 07/01/2021 Document Reviewed: 07/01/2021 ?Elsevier Patient Education ? [place]. ? ?

## 2022-03-17 LAB — CYTOLOGY - PAP
Comment: NEGATIVE
Diagnosis: NEGATIVE
High risk HPV: NEGATIVE

## 2022-07-12 ENCOUNTER — Encounter: Payer: Self-pay | Admitting: Family Medicine

## 2022-07-12 DIAGNOSIS — R42 Dizziness and giddiness: Secondary | ICD-10-CM

## 2022-07-12 DIAGNOSIS — E782 Mixed hyperlipidemia: Secondary | ICD-10-CM

## 2022-07-12 DIAGNOSIS — Z5320 Procedure and treatment not carried out because of patient's decision for unspecified reasons: Secondary | ICD-10-CM

## 2022-07-12 DIAGNOSIS — R0989 Other specified symptoms and signs involving the circulatory and respiratory systems: Secondary | ICD-10-CM

## 2022-07-14 NOTE — Telephone Encounter (Signed)
Please advise on referrals. 

## 2022-07-18 NOTE — Telephone Encounter (Signed)
I have placed a referral to cardiology at the new [place] location for her.

## 2022-07-25 ENCOUNTER — Ambulatory Visit: Payer: Medicare Other | Admitting: Dermatology

## 2022-08-22 ENCOUNTER — Encounter (HOSPITAL_BASED_OUTPATIENT_CLINIC_OR_DEPARTMENT_OTHER): Payer: Self-pay | Admitting: Cardiology

## 2022-08-22 ENCOUNTER — Ambulatory Visit (INDEPENDENT_AMBULATORY_CARE_PROVIDER_SITE_OTHER): Payer: Medicare Other | Admitting: Cardiology

## 2022-08-22 VITALS — BP 140/82 | HR 58 | Ht 65.0 in | Wt 185.2 lb

## 2022-08-22 DIAGNOSIS — E7800 Pure hypercholesterolemia, unspecified: Secondary | ICD-10-CM | POA: Diagnosis not present

## 2022-08-22 DIAGNOSIS — I6522 Occlusion and stenosis of left carotid artery: Secondary | ICD-10-CM | POA: Diagnosis not present

## 2022-08-22 DIAGNOSIS — Z136 Encounter for screening for cardiovascular disorders: Secondary | ICD-10-CM | POA: Diagnosis not present

## 2022-08-22 DIAGNOSIS — Z7189 Other specified counseling: Secondary | ICD-10-CM | POA: Diagnosis not present

## 2022-08-22 DIAGNOSIS — I6523 Occlusion and stenosis of bilateral carotid arteries: Secondary | ICD-10-CM

## 2022-08-22 NOTE — Progress Notes (Signed)
Cardiology Office Note:    Date:  08/22/2022   ID:  Connie Flores, DOB 14-Sep-1956, MRN [id number]  PCP:  Ma Hillock, DO  Cardiologist:  Buford Dresser, MD  Referring MD: Ma Hillock, DO   CC: new patient evaluation for carotid bruit, vertigo, mixed hyperlipidemia  History of Present Illness:    Connie Flores is a 66 y.o. female with a hx of coronary artery disease and heart murmur who is seen as a new consult at the request of Kuneff, Renee A, DO for the evaluation and management of mixed hyperlipidemia, left carotid bruit, vertigo.  Today:  She has no acute concerns today. She recounts some history that in January of 2010 she was in [place] and had to go to the ER for chest pain. A stress test was performed, which showed nothing abnormal. Additionally, as a child she was told on and off that she may or may not have a heart murmur.   When she came back from vacation a little while ago, she experienced some discomfort in her chest and lower back when she would take deep breaths. She felt it again last night.  She states she has been really tired lately and has been taking 2-3 hour naps. However, she mentions that she gets very little sleep at night, which she believes may be a cause of her fatigue. This has been persisting the past several years.   She endorses having tinnitus and becoming dizzy, at times.  For exercise, she has been working on being more physically active with walking. She tries to walk 2-3 miles per day, on her own and walking her dogs. She mentions that there are 19 steps she must go up and down to get home.  In regards to her diet, she has tried to cut down on foods like butter to manage her cholesterol better.  She tends to drink around two drinks in the evening, either wine or beer, and around three drinks on the weekend days.  She denies any palpitations, shortness of breath, or peripheral edema. No headaches, syncope, orthopnea, or  PND.  Past Medical History:  Diagnosis Date   Allergy    CAD (coronary artery disease) 11/08/2019   Chicken pox    Heart murmur    Shingles     Past Surgical History:  Procedure Laterality Date   WISDOM TOOTH EXTRACTION      Current Medications: Current Outpatient Medications on File Prior to Visit  Medication Sig   b complex vitamins tablet Take 1 tablet by mouth daily.   Black Elderberry,Berry-Flower, 575 MG CAPS Take 1 capsule by mouth daily.   Coenzyme Q10 (COQ10) 100 MG CAPS Take by mouth. Daily   LYSINE PO Take by mouth. PRN for cold sores   Red Yeast Rice 600 MG TABS Take 1,200 mg by mouth daily.   VITAMIN D PO Take by mouth.   No current facility-administered medications on file prior to visit.     Allergies:   Patient has no known allergies.   Social History   Tobacco Use   Smoking status: Former    Packs/day: 1.00    Years: 3.00    Total pack years: 3.00    Types: Cigarettes   Smokeless tobacco: Never   Tobacco comments:    quit in the 70's   Vaping Use   Vaping Use: Never used  Substance Use Topics   Alcohol use: Yes    Alcohol/week: 4.0 standard drinks of  alcohol    Types: 4 Glasses of wine per week   Drug use: Never    Family History: family history includes Colon polyps in her brother and sister; Diabetes in her brother; Drug abuse in her daughter; Hyperlipidemia in her brother and brother; Mental illness in her daughter; Miscarriages / Stillbirths in her sister; Ovarian cancer in her sister; Prostate cancer in her brother, brother, and brother. There is no history of Colon cancer, Esophageal cancer, Rectal cancer, or Stomach cancer.  ROS:   Please see the history of present illness.   (+) Fatigue (+) Chest discomfort  (+) Occasional dizziness  Additional pertinent ROS otherwise negative.    EKGs/Labs/Other Studies Reviewed:    The following studies were reviewed today:  Bilateral Carotid Doppler 01/16/2020: Summary:  Right Carotid:  Velocities in the right ICA are consistent with a 1-39%  stenosis. The proximal right ICA dives posteriorly after its origin.   Left Carotid: Velocities in the left ICA are consistent with a 40-59%  stenosis. Left ICA velocities may be elevated due to vessel dive after origin. No dicernable vessel narrowing noted.   Vertebrals:  Bilateral vertebral arteries demonstrate antegrade flow.  Subclavians: Normal flow hemodynamics were seen in bilateral subclavian arteries.    EKG:  EKG is personally reviewed.   08/22/22: Sinus bradycardia at 58 bpm, nonspecific t wave pattern  Recent Labs: 02/09/2022: ALT 15; BUN 16; Creat 0.77; Hemoglobin 12.5; Platelets 230; Potassium 3.9; Sodium 140; TSH 1.42  Recent Lipid Panel    Component Value Date/Time   CHOL 215 (H) 02/09/2022 1440   TRIG 83 02/09/2022 1440   HDL 64 02/09/2022 1440   CHOLHDL 3.4 02/09/2022 1440   VLDL 26.6 02/03/2021 0907   LDLCALC 133 (H) 02/09/2022 1440    Physical Exam:    VS:  BP (!) 140/82 (BP Location: Right Arm, Patient Position: Sitting, Cuff Size: Large)   Pulse (!) 58   Ht '5\' 5"'$  (1.651 m)   Wt 185 lb 3.2 oz (84 kg)   BMI 30.82 kg/m     Wt Readings from Last 3 Encounters:  03/15/22 181 lb (82.1 kg)  02/09/22 179 lb (81.2 kg)  02/09/22 179 lb 6.4 oz (81.4 kg)    GEN: Well nourished, well developed in no acute distress HEENT: Normal, moist mucous membranes NECK: No JVD CARDIAC: regular rhythm, normal S1 and S2, no rubs or gallops. No murmur. VASCULAR: Radial and DP pulses 2+ bilaterally. No carotid bruits RESPIRATORY:  Clear to auscultation without rales, wheezing or rhonchi  ABDOMEN: Soft, non-tender, non-distended MUSCULOSKELETAL:  Ambulates independently SKIN: Warm and dry, no edema NEUROLOGIC:  Alert and oriented x 3. No focal neuro deficits noted. PSYCHIATRIC:  Normal affect    ASSESSMENT:    1. Carotid artery stenosis, asymptomatic, left   2. Screening for heart disease   3. Cardiac risk counseling    4. Counseling on health promotion and disease prevention   5. Pure hypercholesterolemia     PLAN:    Chest pain, atypical Bilateral carotid artery stenosis, nonobstructive Hypercholesterolemia Screening for CAD -discussed that carotid plaque is consistent with ASCVD. She does not have anginal symptoms but would like screening for CAD.  -discussed calcium score, limitations/benefits, out of pocket cost. She wishes to proceed -reviewed red flag warning signs that need immediate medical attention -discussed recommendations for statin and aspirin today. She would like to use the calcium score to guide further decision making -discussed supplement data, below  Cardiac risk counseling and prevention recommendations: -  recommend heart healthy/Mediterranean diet, with whole grains, fruits, vegetable, fish, lean meats, nuts, and olive oil. Limit salt. -recommend moderate walking, 3-5 times/week for 30-50 minutes each session. Aim for at least 150 minutes.week. Goal should be pace of 3 miles/hours, or walking 1.5 miles in 30 minutes -recommend avoidance of tobacco products. Avoid excess alcohol. -ASCVD risk score: The 10-year ASCVD risk score (Arnett DK, et al., 2019) is: 6.9%   Values used to calculate the score:     Age: 61 years     Sex: Female     Is Non-Hispanic African American: No     Diabetic: No     Tobacco smoker: No     Systolic Blood Pressure: 993 mmHg     Is BP treated: No     HDL Cholesterol: 64 mg/dL     Total Cholesterol: 215 mg/dL    Plan for follow up: 1 month.  Buford Dresser, MD, PhD, [facility]    Medication Adjustments/Labs and Tests Ordered: Current medicines are reviewed at length with the patient today.  Concerns regarding medicines are outlined above.  Orders Placed This Encounter  Procedures   CT CARDIAC SCORING (SELF PAY ONLY)   EKG 12-Lead   VAS US CAROTID   No orders of the defined types were placed in this  encounter.  Patient Instructions  Medication Instructions:  Your Physician recommend you continue on your current medication as directed.    *If you need a refill on your cardiac medications before your next appointment, please call your pharmacy*  Testing/Procedures: Your physician has requested that you have a carotid duplex. This test is an ultrasound of the carotid arteries in your neck. It looks at blood flow through these arteries that supply the brain with blood. Allow one hour for this exam. There are no restrictions or special instructions.  Your physician has recommend you to have a coronary calcium score. This is a self pay test that will cost $99  Follow-Up: At [facility], you and your health needs are our priority.  As part of our continuing mission to provide you with exceptional heart care, we have created designated Provider Care Teams.  These Care Teams include your primary Cardiologist (physician) and Advanced Practice Providers (APPs -  Physician Assistants and Nurse Practitioners) who all work together to provide you with the care you need, when you need it.  We recommend signing up for the patient portal called "MyChart".  Sign up information is provided on this After Visit Summary.  MyChart is used to connect with patients for Virtual Visits (Telemedicine).  Patients are able to view lab/test results, encounter notes, upcoming appointments, etc.  Non-urgent messages can be sent to your provider as well.   To learn more about what you can do with MyChart, go to [email or web address].    Your next appointment:   1 month(s)  The format for your next appointment:   In Person  Provider:   Dr. Buford Dresser     Other Information:   Recent data, summarized from [facility] from a study from the [facility]:  https://wilkins.info/  "Researchers at the [facility] set out to answer this question by comparing statins to supplements in a clinical trial. They tracked the outcomes of 190 adults, ages 76 to 38. Some participants were given a 5 mg daily dose of rosuvastatin, a statin that is sold under the brand name Crestor for 28 days. Others were given supplements, including fish oil,  cinnamon, garlic, turmeric, plant sterols or red yeast rice for the same period."  "What we found was that rosuvastatin lowered LDL cholesterol by almost 38% and that was vastly superior to placebo and any of the six supplements studied in the trial," study Despina Hidden, M.D. of the [facility] [place] told NPR. He says this level of reduction is enough to lower the risk of heart attacks and strokes. The findings are published in the Journal of the [employer] of Cardiology.  "Oftentimes these supplements are marketed as 'natural ways' to lower your cholesterol," says Laffin. But he says none of the dietary supplements demonstrated any significant decrease in LDL cholesterol compared with a placebo. LDL cholesterol is considered the 'bad cholesterol' because it can contribute to plaque build-up in the artery walls - which can narrow the arteries, and set the stage for heart attacks and strokes"       I,Breanna Adamick,acting as a scribe for Buford Dresser, MD.,have documented all relevant documentation on the behalf of Buford Dresser, MD,as directed by  Buford Dresser, MD while in the presence of Buford Dresser, MD.  I, Buford Dresser, MD, have reviewed all documentation for this visit. The documentation on 11/27/22 for the exam, diagnosis, procedures, and orders are all accurate and complete.    Signed, Buford Dresser, MD PhD 08/22/2022     [place]

## 2022-08-22 NOTE — Patient Instructions (Addendum)
Medication Instructions:  Your Physician recommend you continue on your current medication as directed.    *If you need a refill on your cardiac medications before your next appointment, please call your pharmacy*  Testing/Procedures: Your physician has requested that you have a carotid duplex. This test is an ultrasound of the carotid arteries in your neck. It looks at blood flow through these arteries that supply the brain with blood. Allow one hour for this exam. There are no restrictions or special instructions.  Your physician has recommend you to have a coronary calcium score. This is a self pay test that will cost $99  Follow-Up: At [facility], you and your health needs are our priority.  As part of our continuing mission to provide you with exceptional heart care, we have created designated Provider Care Teams.  These Care Teams include your primary Cardiologist (physician) and Advanced Practice Providers (APPs -  Physician Assistants and Nurse Practitioners) who all work together to provide you with the care you need, when you need it.  We recommend signing up for the patient portal called "MyChart".  Sign up information is provided on this After Visit Summary.  MyChart is used to connect with patients for Virtual Visits (Telemedicine).  Patients are able to view lab/test results, encounter notes, upcoming appointments, etc.  Non-urgent messages can be sent to your provider as well.   To learn more about what you can do with MyChart, go to [email or web address].    Your next appointment:   1 month(s)  The format for your next appointment:   In Person  Provider:   Dr. Buford Dresser     Other Information:   Recent data, summarized from [facility] from a study from the [facility]:  https://wilkins.info/  "Researchers at the [facility] set out to answer this question by comparing statins to supplements in a clinical trial. They tracked the outcomes of 190 adults, ages 76 to 64. Some participants were given a 5 mg daily dose of rosuvastatin, a statin that is sold under the brand name Crestor for 28 days. Others were given supplements, including fish oil, cinnamon, garlic, turmeric, plant sterols or red yeast rice for the same period."  "What we found was that rosuvastatin lowered LDL cholesterol by almost 38% and that was vastly superior to placebo and any of the six supplements studied in the trial," study Despina Hidden, M.D. of the [facility], [place] told NPR. He says this level of reduction is enough to lower the risk of heart attacks and strokes. The findings are published in the Journal of the [employer] of Cardiology.  "Oftentimes these supplements are marketed as 'natural ways' to lower your cholesterol," says Laffin. But he says none of the dietary supplements demonstrated any significant decrease in LDL cholesterol compared with a placebo. LDL cholesterol is considered the 'bad cholesterol' because it can contribute to plaque build-up in the artery walls - which can narrow the arteries, and set the stage for heart attacks and strokes"

## 2022-08-30 ENCOUNTER — Encounter (HOSPITAL_BASED_OUTPATIENT_CLINIC_OR_DEPARTMENT_OTHER): Payer: Self-pay

## 2022-08-30 ENCOUNTER — Ambulatory Visit (HOSPITAL_BASED_OUTPATIENT_CLINIC_OR_DEPARTMENT_OTHER)
Admission: RE | Admit: 2022-08-30 | Discharge: 2022-08-30 | Disposition: A | Discharge status: Home / Self Care | Payer: Medicare Other | Source: Ambulatory Visit | Attending: Cardiology | Admitting: Cardiology

## 2022-08-30 DIAGNOSIS — Z136 Encounter for screening for cardiovascular disorders: Secondary | ICD-10-CM | POA: Insufficient documentation

## 2022-09-06 ENCOUNTER — Ambulatory Visit (INDEPENDENT_AMBULATORY_CARE_PROVIDER_SITE_OTHER): Payer: Medicare Other

## 2022-09-06 DIAGNOSIS — I6522 Occlusion and stenosis of left carotid artery: Secondary | ICD-10-CM

## 2022-09-07 ENCOUNTER — Other Ambulatory Visit (HOSPITAL_BASED_OUTPATIENT_CLINIC_OR_DEPARTMENT_OTHER): Payer: Self-pay

## 2022-09-07 DIAGNOSIS — I6523 Occlusion and stenosis of bilateral carotid arteries: Secondary | ICD-10-CM

## 2022-09-22 ENCOUNTER — Ambulatory Visit (INDEPENDENT_AMBULATORY_CARE_PROVIDER_SITE_OTHER): Payer: Medicare Other | Admitting: Cardiology

## 2022-09-22 ENCOUNTER — Encounter (HOSPITAL_BASED_OUTPATIENT_CLINIC_OR_DEPARTMENT_OTHER): Payer: Self-pay | Admitting: Cardiology

## 2022-09-22 VITALS — BP 134/92 | HR 65 | Ht 65.0 in | Wt 184.0 lb

## 2022-09-22 DIAGNOSIS — Z7189 Other specified counseling: Secondary | ICD-10-CM | POA: Diagnosis not present

## 2022-09-22 DIAGNOSIS — Z712 Person consulting for explanation of examination or test findings: Secondary | ICD-10-CM | POA: Diagnosis not present

## 2022-09-22 DIAGNOSIS — I700 Atherosclerosis of aorta: Secondary | ICD-10-CM

## 2022-09-22 DIAGNOSIS — I6523 Occlusion and stenosis of bilateral carotid arteries: Secondary | ICD-10-CM | POA: Diagnosis not present

## 2022-09-22 DIAGNOSIS — E7800 Pure hypercholesterolemia, unspecified: Secondary | ICD-10-CM | POA: Diagnosis not present

## 2022-09-22 DIAGNOSIS — Z5181 Encounter for therapeutic drug level monitoring: Secondary | ICD-10-CM

## 2022-09-22 MED ORDER — ROSUVASTATIN CALCIUM 5 MG PO TABS: 5.0000 mg | ORAL_TABLET | Freq: Every day | ORAL | 3 refills | Status: DC

## 2022-09-22 NOTE — Patient Instructions (Addendum)
  Medication Instructions:  We are starting a low dose of rosuvastatin. We will recheck your bloodwork in about 3 months. I would stop the red yeast rice with the start of rosuvastatin.  Testing/Procedures: NONE  Follow-Up: Your physician wants you to follow-up in: 1 YEAR  You will receive a reminder letter in the mail two months in advance. If you don't receive a letter, please call our office to schedule the follow-up appointment.

## 2022-09-22 NOTE — Progress Notes (Signed)
Cardiology Office Note:    Date:  09/25/2022   ID:  Connie Flores, DOB 11-Apr-1956, MRN [id number]  PCP:  Ma Hillock, DO  Cardiologist:  Buford Dresser, MD  Referring MD: Ma Hillock, DO    History of Present Illness:    Connie Flores is Flores 66 y.o. female with Flores hx of coronary artery disease and heart murmur who is seen for follow up today. I initially met her 08/22/22 as Flores new consult at the request of Kuneff, Renee A, DO for the evaluation and management of mixed hyperlipidemia, left carotid bruit, vertigo.  CV history: In 2010, admitted to [facility], [place]. Had lexiscan myoview and ultrasounds of the legs after chest pain, told her heart was fine.  At her last visit she reported Flores few episodes of chest discomfort. She was also very fatigued and frequently taking 2-3 hour naps. She endorsed tinnitus and occasional dizziness.   CT scan 08/22/22 revealed Flores coronary calcium score of 0. Mildly dilated caliber of ascending aorta (measures approximately 40 mm on this noncontrast study). Aortic atherosclerosis noted. Her carotid studies 09/06/22 showed moderate disease of both the left and right carotid arteries (40-59% stenosis).  Today, she complains of Flores left-sided throbbing pain in her neck, with Flores dull radiating pain upwards. Randomly she is also aware of feeling brief, pressure-like sensations on her arms and legs.  She wonders if she may have sleep apnea. Usually she wakes up and feels rested. Often she wakes up with Flores dry mouth, and she confirms snoring. She tosses and turns frequently at night, and usually takes Flores nap during the day.  For exercise she will go on walks. She is continuing with dietary changes such as cutting back on butter. Also she cooks with olive oil.  In her family, her sister had TIAs beginning in her 31s.  She denies any palpitations, chest pain, shortness of breath, or peripheral edema. No lightheadedness, headaches, syncope,  orthopnea, or PND.   Past Medical History:  Diagnosis Date   Allergy    CAD (coronary artery disease) 11/08/2019   Chicken pox    Heart murmur    Shingles     Past Surgical History:  Procedure Laterality Date   WISDOM TOOTH EXTRACTION      Current Medications: Current Outpatient Medications on File Prior to Visit  Medication Sig   b complex vitamins tablet Take 1 tablet by mouth daily.   Black Elderberry,Berry-Flower, 575 MG CAPS Take 1 capsule by mouth daily.   Coenzyme Q10 (COQ10) 100 MG CAPS Take by mouth. Daily   LYSINE PO Take by mouth. PRN for cold sores   VITAMIN D PO Take by mouth.   No current facility-administered medications on file prior to visit.     Allergies:   Patient has no known allergies.   Social History   Tobacco Use   Smoking status: Former    Packs/day: 1.00    Years: 3.00    Total pack years: 3.00    Types: Cigarettes   Smokeless tobacco: Never   Tobacco comments:    quit in the 70's   Vaping Use   Vaping Use: Never used  Substance Use Topics   Alcohol use: Yes    Alcohol/week: 4.0 standard drinks of alcohol    Types: 4 Glasses of wine per week   Drug use: Never    Family History: family history includes Colon polyps in her brother and sister; Diabetes in her brother;  Drug abuse in her daughter; Hyperlipidemia in her brother and brother; Mental illness in her daughter; Miscarriages / Stillbirths in her sister; Ovarian cancer in her sister; Prostate cancer in her brother, brother, and brother. There is no history of Colon cancer, Esophageal cancer, Rectal cancer, or Stomach cancer.  ROS:   Please see the history of present illness.   (+) Left-sided throbbing neck pain (+) Dry mouth in the mornings (+) Snoring Additional pertinent ROS otherwise negative.    EKGs/Labs/Other Studies Reviewed:    The following studies were reviewed today:  Bilateral Carotid Doppler  09/06/2022: Summary:  Right Carotid: Velocities in the right ICA  are consistent with Flores 40-59%                 stenosis.   Left Carotid: Velocities in the left ICA are consistent with Flores 40-59%  stenosis.   Vertebrals: Bilateral vertebral arteries demonstrate antegrade flow.  Subclavians: Normal flow hemodynamics were seen in bilateral subclavian               arteries.   CT Calcium Scoring  08/30/2022: IMPRESSION: Coronary calcium score of 0. This was 0 percentile for age-, race-, and sex-matched controls. Mildly dilated caliber of ascending aorta (measures approximately 40 mm on this noncontrast study). Aortic atherosclerosis noted.  Bilateral Carotid Doppler 01/16/2020: Summary:  Right Carotid: Velocities in the right ICA are consistent with Flores 1-39%  stenosis. The proximal right ICA dives posteriorly after its origin.   Left Carotid: Velocities in the left ICA are consistent with Flores 40-59%  stenosis. Left ICA velocities may be elevated due to vessel dive after origin. No dicernable vessel narrowing noted.   Vertebrals:  Bilateral vertebral arteries demonstrate antegrade flow.  Subclavians: Normal flow hemodynamics were seen in bilateral subclavian arteries.    EKG:  EKG is personally reviewed.   09/22/2022:  EKG was not ordered. 08/22/2022:  Sinus bradycardia at 58 bpm. Low voltage QRS.  Recent Labs: 02/09/2022: ALT 15; BUN 16; Creat 0.77; Hemoglobin 12.5; Platelets 230; Potassium 3.9; Sodium 140; TSH 1.42   Recent Lipid Panel    Component Value Date/Time   CHOL 215 (H) 02/09/2022 1440   TRIG 83 02/09/2022 1440   HDL 64 02/09/2022 1440   CHOLHDL 3.4 02/09/2022 1440   VLDL 26.6 02/03/2021 0907   LDLCALC 133 (H) 02/09/2022 1440    Physical Exam:    VS:  BP (!) 134/92   Pulse 65   Ht _0  (1.651 m)   Wt 184 lb (83.5 kg)   BMI 30.62 kg/m     Wt Readings from Last 3 Encounters:  09/22/22 184 lb (83.5 kg)  08/22/22 185 lb 3.2 oz (84 kg)  03/15/22 181 lb (82.1 kg)    GEN: Well nourished, well developed in no acute  distress HEENT: Normal, moist mucous membranes NECK: No JVD CARDIAC: regular rhythm, normal S1 and S2, no rubs or gallops. No murmur. VASCULAR: Radial and DP pulses 2+ bilaterally. No carotid bruits RESPIRATORY:  Clear to auscultation without rales, wheezing or rhonchi  ABDOMEN: Soft, non-tender, non-distended MUSCULOSKELETAL:  Ambulates independently SKIN: Warm and dry, no edema NEUROLOGIC:  Alert and oriented x 3. No focal neuro deficits noted. PSYCHIATRIC:  Normal affect    ASSESSMENT:    1. Bilateral carotid artery stenosis   2. Pure hypercholesterolemia   3. Cardiac risk counseling   4. Counseling on health promotion and disease prevention   5. Encounter to discuss test results   6. Therapeutic drug  monitoring   7. Aortic atherosclerosis (HCC)     PLAN:    Chest pain, atypical Bilateral carotid artery stenosis, nonobstructive Hypercholesterolemia Aortic atherosclerosis -we reviewed her calcium score of 0, which is reassuring -she does have nonobstructive bilateral carotid stenosis (40-59%) and aortic atherosclerosis -we discussed the discrepancy in guidelines based on this; Flores calcium score of 0 would suggest against Flores statin, but carotid stenosis and aortic atherosclerosis suggest she would benefit from Flores statin -we discussed options today. After shared decision making, will start low dose rosuvastatin, check lipids/lfts in 3 mos -discussed data on supplements. With start of statin, recommended stopping red yeast rice  Cardiac risk counseling and prevention recommendations: -recommend heart healthy/Mediterranean diet, with whole grains, fruits, vegetable, fish, lean meats, nuts, and olive oil. Limit salt. -recommend moderate walking, 3-5 times/week for 30-50 minutes each session. Aim for at least 150 minutes.week. Goal should be pace of 3 miles/hours, or walking 1.5 miles in 30 minutes -recommend avoidance of tobacco products. Avoid excess alcohol. -ASCVD risk score: The  10-year ASCVD risk score (Arnett DK, et al., 2019) is: 8.2%   Values used to calculate the score:     Age: 82 years     Sex: Female     Is Non-Hispanic African American: No     Diabetic: No     Tobacco smoker: No     Systolic Blood Pressure: 378 mmHg     Is BP treated: No     HDL Cholesterol: 64 mg/dL     Total Cholesterol: 215 mg/dL    Plan for follow up: 1 year or sooner as needed.  Buford Dresser, MD, PhD, [facility]    Medication Adjustments/Labs and Tests Ordered: Current medicines are reviewed at length with the patient today.  Concerns regarding medicines are outlined above.   Orders Placed This Encounter  Procedures   Lipid panel   Hepatic function panel   Meds ordered this encounter  Medications   rosuvastatin (CRESTOR) 5 MG tablet    Sig: Take 1 tablet (5 mg total) by mouth daily.    Dispense:  90 tablet    Refill:  3   Patient Instructions   Medication Instructions:  We are starting Flores low dose of rosuvastatin. We will recheck your bloodwork in about 3 months. I would stop the red yeast rice with the start of rosuvastatin.  Testing/Procedures: NONE  Follow-Up: Your physician wants you to follow-up in: 1 YEAR  You will receive Flores reminder letter in the mail two months in advance. If you don't receive Flores letter, please call our office to schedule the follow-up appointment.      I,Mathew Stumpf,acting as Flores Education administrator for [employer], MD.,have documented all relevant documentation on the behalf of Buford Dresser, MD,as directed by  Buford Dresser, MD while in the presence of Buford Dresser, MD.  I, Buford Dresser, MD, have reviewed all documentation for this visit. The documentation on 09/25/22 for the exam, diagnosis, procedures, and orders are all accurate and complete.   Signed, Buford Dresser, MD PhD 09/25/2022 9:52 PM    [place]

## 2022-10-11 IMAGING — MG MM DIGITAL SCREENING BILAT W/ TOMO AND CAD
8 series · 8 of 24 positions shown · non-contrast
Comparison: None available.

CLINICAL DATA: Screening.

EXAM:
DIGITAL SCREENING BILATERAL MAMMOGRAM WITH TOMOSYNTHESIS AND CAD
TECHNIQUE: Bilateral screening digital craniocaudal and mediolateral oblique
mammograms were obtained. Bilateral screening digital breast
tomosynthesis was performed. The images were evaluated with
computer-aided detection.

[L CC synth-2D]
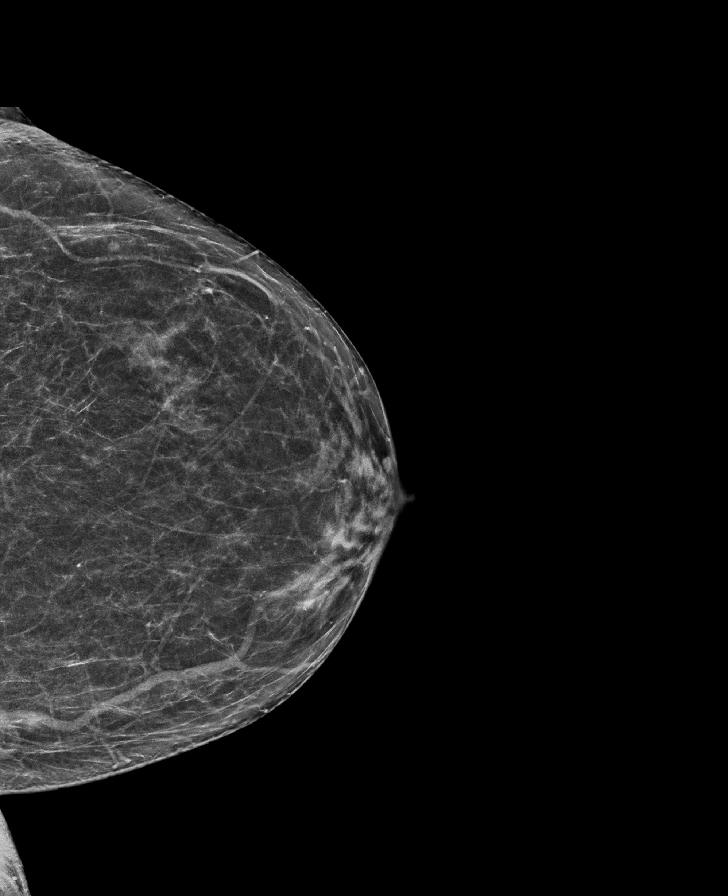

[R CC synth-2D]
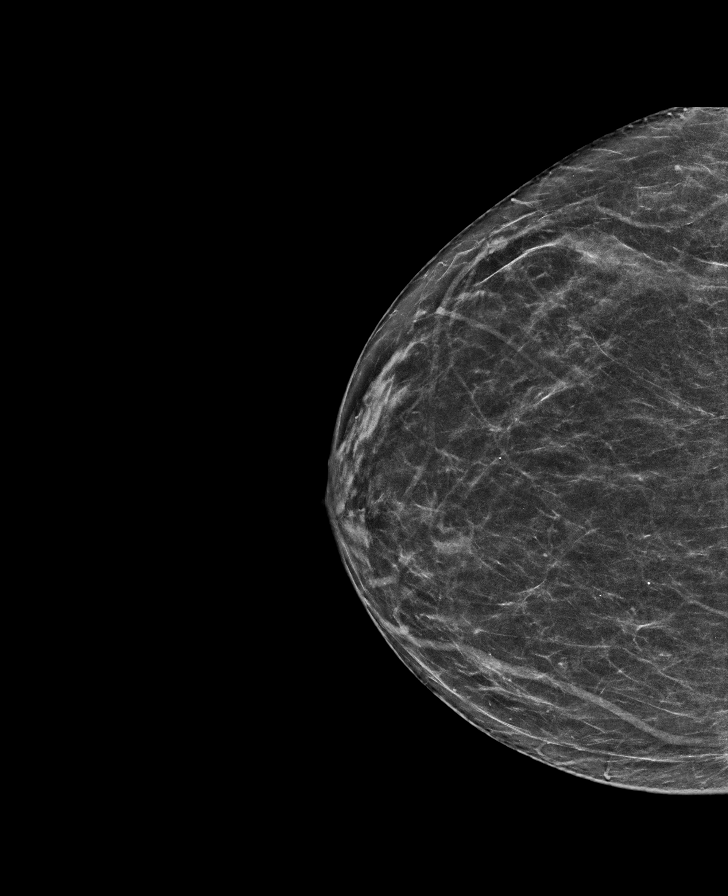

[L MLO synth-2D]
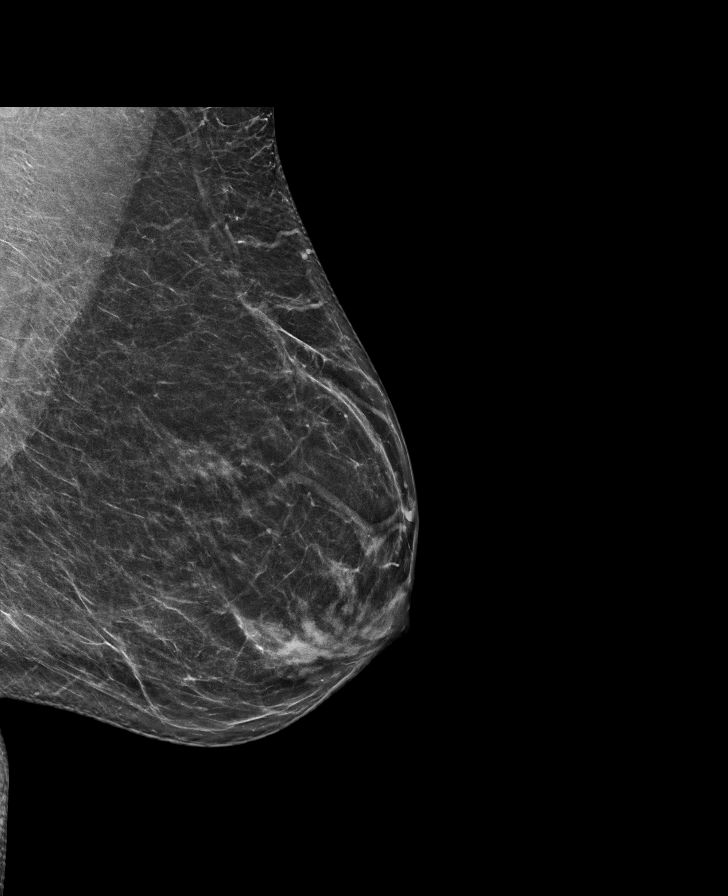

[R MLO synth-2D]
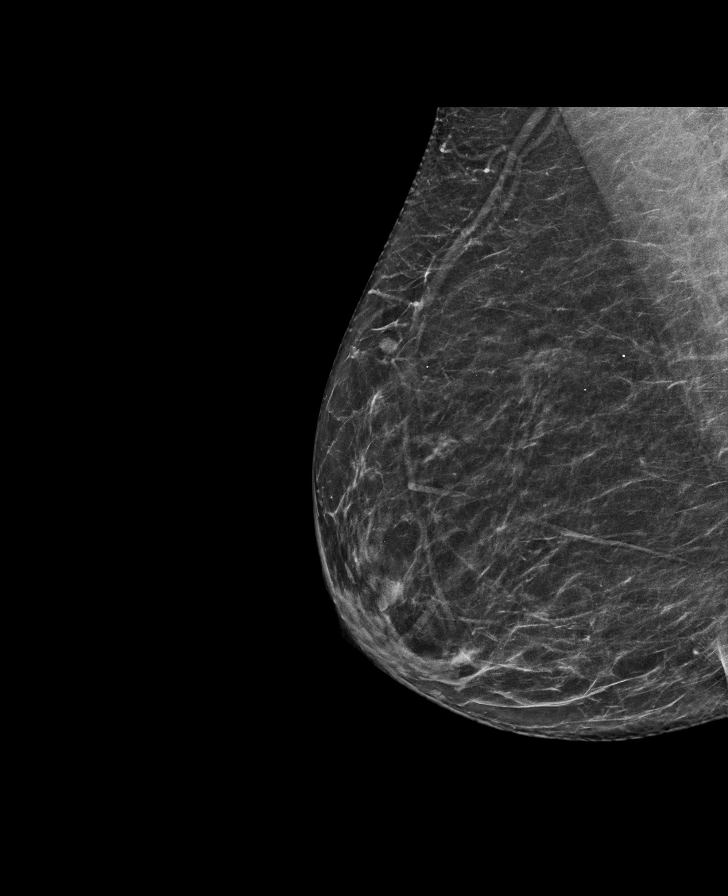

[R MLO tomo · tomo slice 37/72.0]
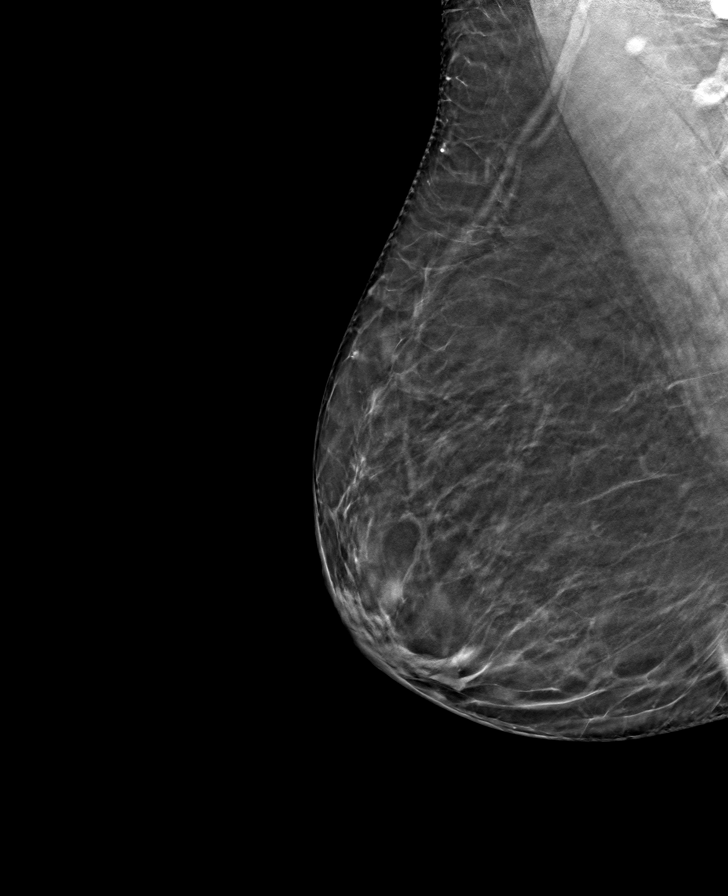

[R CC tomo · tomo slice 33/66.0]
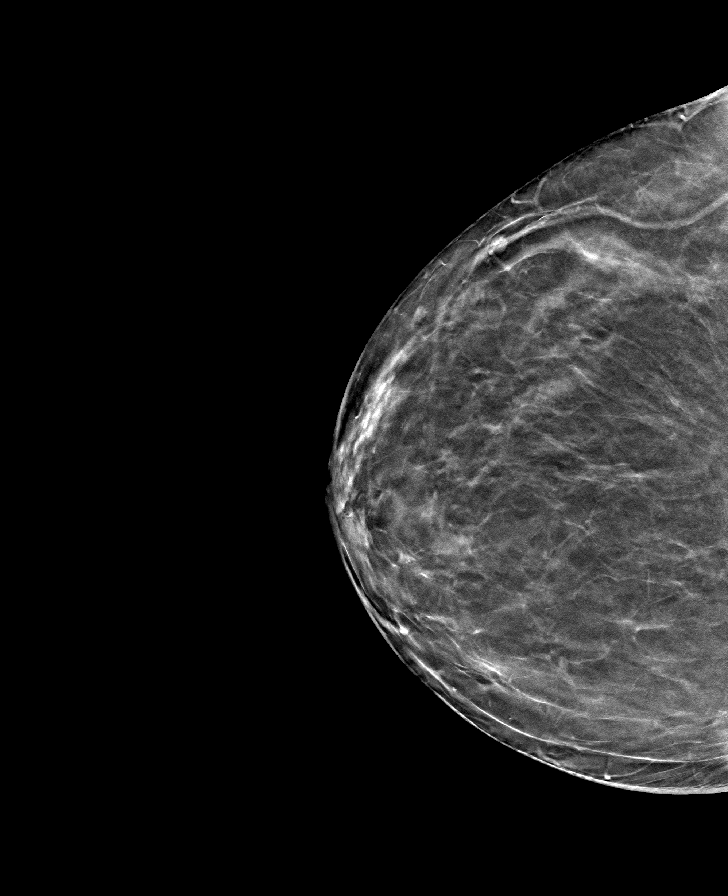

[L CC tomo · tomo slice 32/63.0]
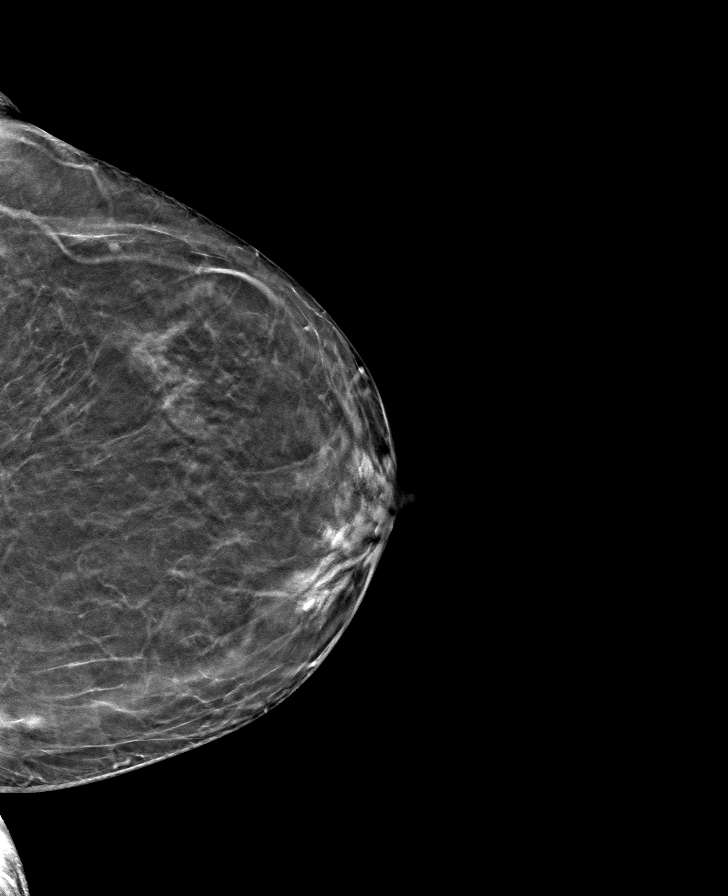

[L MLO tomo · tomo slice 37/73.0]
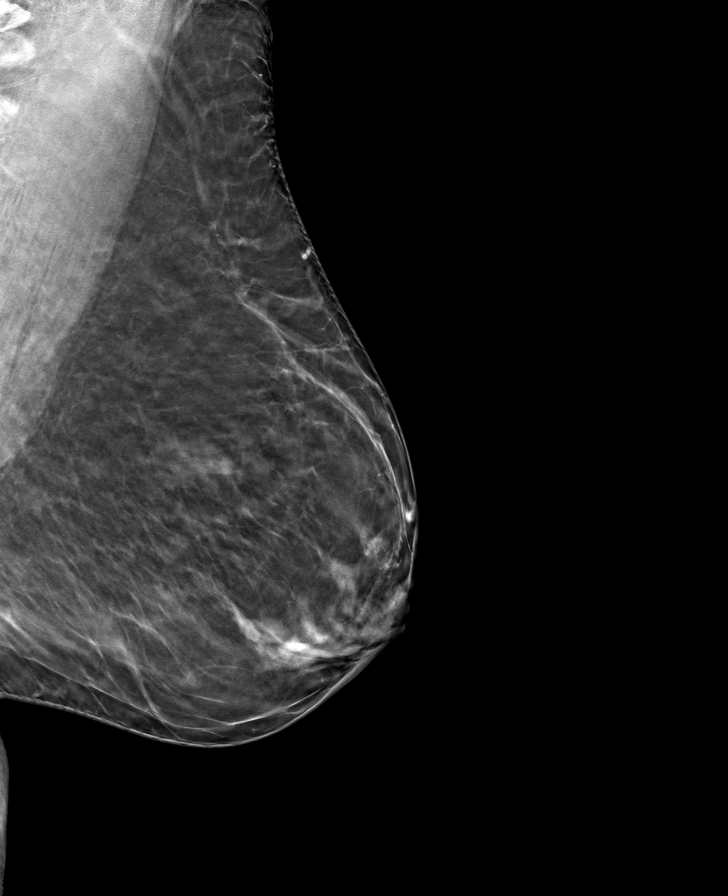

[8 of 24 positions shown; findings below may reference images not displayed]

ACR Breast Density Category b: There are scattered areas of
fibroglandular density.
FINDINGS: There are no findings suspicious for malignancy.
IMPRESSION: No mammographic evidence of malignancy. A result letter of this
screening mammogram will be mailed directly to the patient.

RECOMMENDATION:
Screening mammogram in one year. (Code:[id number]-[id number])

BI-RADS CATEGORY  1: Negative.

## 2022-11-27 ENCOUNTER — Encounter (HOSPITAL_BASED_OUTPATIENT_CLINIC_OR_DEPARTMENT_OTHER): Payer: Self-pay | Admitting: Cardiology

## 2023-01-16 ENCOUNTER — Encounter (HOSPITAL_BASED_OUTPATIENT_CLINIC_OR_DEPARTMENT_OTHER): Payer: Self-pay

## 2023-01-17 LAB — HEPATIC FUNCTION PANEL
ALT: 15 IU/L (ref 0–32)
AST: 21 IU/L (ref 0–40)
Albumin: 4.7 g/dL (ref 3.9–4.9)
Alkaline Phosphatase: 60 IU/L (ref 44–121)
Bilirubin Total: 0.6 mg/dL (ref 0.0–1.2)
Bilirubin, Direct: 0.16 mg/dL (ref 0.00–0.40)
Total Protein: 6.8 g/dL (ref 6.0–8.5)

## 2023-01-17 LAB — LIPID PANEL
Chol/HDL Ratio: 2.7 ratio (ref 0.0–4.4)
Cholesterol, Total: 198 mg/dL (ref 100–199)
HDL: 73 mg/dL (ref >39)
LDL Chol Calc (NIH): 107 mg/dL — ABNORMAL HIGH (ref 0–99)
Triglycerides: 100 mg/dL (ref 0–149)
VLDL Cholesterol Cal: 18 mg/dL (ref 5–40)

## 2023-02-07 ENCOUNTER — Telehealth (HOSPITAL_BASED_OUTPATIENT_CLINIC_OR_DEPARTMENT_OTHER): Payer: Self-pay

## 2023-02-07 NOTE — Telephone Encounter (Addendum)
Results called to patient who verbalizes understanding! Patient is going to remain on Rosuvastatin. She will call us if she encounters further problems.     ----- Message from Loel Dubonnet, NP sent at 02/07/2023  1:19 PM EDT ----- Cholesterol numbers did improve on Rosuvasatin. However, as of 2 weeks ago she was holding due to myalgias.   If no improvement in myalgias, recommend return to Rosuvastatin 5mg  daily. If myalgias improved, may instead utilize Zetia 10mg  daily.

## 2023-02-15 ENCOUNTER — Ambulatory Visit: Payer: Medicare Other

## 2023-02-15 ENCOUNTER — Encounter: Payer: Self-pay | Admitting: Family Medicine

## 2023-02-15 ENCOUNTER — Ambulatory Visit (INDEPENDENT_AMBULATORY_CARE_PROVIDER_SITE_OTHER): Payer: Medicare Other | Admitting: Family Medicine

## 2023-02-15 VITALS — BP 131/70 | HR 62 | Temp 98.1°F | Ht 65.0 in | Wt 181.8 lb

## 2023-02-15 DIAGNOSIS — Z1231 Encounter for screening mammogram for malignant neoplasm of breast: Secondary | ICD-10-CM

## 2023-02-15 DIAGNOSIS — Z79899 Other long term (current) drug therapy: Secondary | ICD-10-CM

## 2023-02-15 DIAGNOSIS — I6522 Occlusion and stenosis of left carotid artery: Secondary | ICD-10-CM

## 2023-02-15 DIAGNOSIS — E782 Mixed hyperlipidemia: Secondary | ICD-10-CM | POA: Diagnosis not present

## 2023-02-15 DIAGNOSIS — Z0000 Encounter for general adult medical examination without abnormal findings: Secondary | ICD-10-CM

## 2023-02-15 LAB — CBC WITH DIFFERENTIAL/PLATELET
Basophils Absolute: 0 10*3/uL (ref 0.0–0.1)
Basophils Relative: 0.4 % (ref 0.0–3.0)
Eosinophils Absolute: 0.1 10*3/uL (ref 0.0–0.7)
Eosinophils Relative: 1.7 % (ref 0.0–5.0)
HCT: 39.6 % (ref 36.0–46.0)
Hemoglobin: 13.1 g/dL (ref 12.0–15.0)
Lymphocytes Relative: 39.4 % (ref 12.0–46.0)
Lymphs Abs: 1.2 10*3/uL (ref 0.7–4.0)
MCHC: 33.1 g/dL (ref 30.0–36.0)
MCV: 93.5 fl (ref 78.0–100.0)
Monocytes Absolute: 0.3 10*3/uL (ref 0.1–1.0)
Monocytes Relative: 8.6 % (ref 3.0–12.0)
Neutro Abs: 1.5 10*3/uL (ref 1.4–7.7)
Neutrophils Relative %: 49.9 % (ref 43.0–77.0)
Platelets: 241 10*3/uL (ref 150.0–400.0)
RBC: 4.24 Mil/uL (ref 3.87–5.11)
RDW: 14.2 % (ref 11.5–15.5)
WBC: 3.1 10*3/uL — ABNORMAL LOW (ref 4.0–10.5)

## 2023-02-15 LAB — BASIC METABOLIC PANEL
BUN: 11 mg/dL (ref 6–23)
CO2: 27 mEq/L (ref 19–32)
Calcium: 9.6 mg/dL (ref 8.4–10.5)
Chloride: 103 mEq/L (ref 96–112)
Creatinine, Ser: 0.74 mg/dL (ref 0.40–1.20)
GFR: 82.7 mL/min (ref >60.00)
Glucose, Bld: 104 mg/dL — ABNORMAL HIGH (ref 70–99)
Potassium: 4.1 mEq/L (ref 3.5–5.1)
Sodium: 140 mEq/L (ref 135–145)

## 2023-02-15 LAB — TSH: TSH: 1.27 u[IU]/mL (ref 0.35–5.50)

## 2023-02-15 LAB — HEMOGLOBIN A1C: Hgb A1c MFr Bld: 5.6 % (ref 4.6–6.5)

## 2023-02-15 NOTE — Progress Notes (Signed)
.  Subjective:   Connie Flores is a 67 y.o. female who presents for Medicare Annual (Subsequent) preventive examination.  Review of Systems     Cardiac Risk Factors include: advanced age (>66men, >2 women);dyslipidemia     Objective:    Today's Vitals   02/15/23 0817  BP: 131/70  Pulse: 62  Temp: 98.1 F (36.7 C)  SpO2: 98%  Weight: 181 lb 12.8 oz (82.5 kg)  Height: 5\' 5"  (1.651 m)   Body mass index is 30.25 kg/m.     02/15/2023    8:08 AM 02/09/2022    1:54 PM 02/03/2021    9:11 AM 01/31/2020    9:00 AM 12/18/2019    8:16 AM  Advanced Directives  Does Patient Have a Medical Advance Directive? Yes No Yes No No  Type of Advance Directive Living will  Healthcare Power of Attorney    Does patient want to make changes to medical advance directive? No - Patient declined      Copy of Healthcare Power of Attorney in Chart?   No - copy requested    Would patient like information on creating a medical advance directive?  Yes (MAU/Ambulatory/Procedural Areas - Information given)  Yes (ED - Information included in AVS) No - Patient declined    Current Medications (verified) Outpatient Encounter Medications as of 02/15/2023  Medication Sig   rosuvastatin (CRESTOR) 5 MG tablet Take 1 tablet (5 mg total) by mouth daily.   b complex vitamins tablet Take 1 tablet by mouth daily. (Patient not taking: Reported on 02/15/2023)   Black Elderberry,Berry-Flower, 575 MG CAPS Take 1 capsule by mouth daily. (Patient not taking: Reported on 02/15/2023)   Coenzyme Q10 (COQ10) 100 MG CAPS Take by mouth. Daily (Patient not taking: Reported on 02/15/2023)   LYSINE PO Take by mouth. PRN for cold sores (Patient not taking: Reported on 02/15/2023)   VITAMIN D PO Take by mouth. (Patient not taking: Reported on 02/15/2023)   No facility-administered encounter medications on file as of 02/15/2023.    Allergies (verified) Patient has no known allergies.   History: Past Medical History:  Diagnosis Date    Allergy    CAD (coronary artery disease) 11/08/2019   Chicken pox    Heart murmur    Shingles    Past Surgical History:  Procedure Laterality Date   WISDOM TOOTH EXTRACTION     Family History  Problem Relation Age of Onset   Ovarian cancer Sister        died 03/31/2020   Miscarriages / [place] Sister    Colon polyps Sister    Diabetes Brother    Prostate cancer Brother    Hyperlipidemia Brother    Drug abuse Daughter    Mental illness Daughter    Prostate cancer Brother    Colon polyps Brother    Hyperlipidemia Brother    Prostate cancer Brother    Colon cancer Neg Hx    Esophageal cancer Neg Hx    Rectal cancer Neg Hx    Stomach cancer Neg Hx    Social History   Socioeconomic History   Marital status: Single    Spouse name: Not on file   Number of children: Not on file   Years of education: Not on file   Highest education level: Not on file  Occupational History   Occupation: retired  Tobacco Use   Smoking status: Former    Packs/day: 1.00    Years: 3.00    Additional pack years:  0.00    Total pack years: 3.00    Types: Cigarettes   Smokeless tobacco: Never   Tobacco comments:    quit in the 70's   Vaping Use   Vaping Use: Never used  Substance and Sexual Activity   Alcohol use: Yes    Alcohol/week: 4.0 standard drinks of alcohol    Types: 4 Glasses of wine per week   Drug use: Never   Sexual activity: Not Currently    Comment: 1st intercourse 18 yo-5 partners  Other Topics Concern   Not on file  Social History Narrative   Marital status/children/pets: Divorced.  3 children.  From [place] 2018.   Education/employment: Some college.  Retired   Field seismologist:      -smoke alarm in the home:Yes     - wears seatbelt: Yes     - Feels safe in their relationships: Yes   Social Determinants of Health   Financial Resource Strain: Low Risk  (02/15/2023)   Overall Financial Resource Strain (CARDIA)    Difficulty of Paying Living Expenses: Not hard at all   Food Insecurity: No Food Insecurity (02/15/2023)   Hunger Vital Sign    Worried About Running Out of Food in the Last Year: Never true    Ran Out of Food in the Last Year: Never true  Transportation Needs: No Transportation Needs (02/15/2023)   PRAPARE - Administrator, Civil Service (Medical): No    Lack of Transportation (Non-Medical): No  Physical Activity: Sufficiently Active (02/15/2023)   Exercise Vital Sign    Days of Exercise per Week: 7 days    Minutes of Exercise per Session: 60 min  Stress: No Stress Concern Present (02/15/2023)   [employer] of Occupational Health - Occupational Stress Questionnaire    Feeling of Stress : Not at all  Social Connections: Moderately Integrated (02/15/2023)   Social Connection and Isolation Panel [NHANES]    Frequency of Communication with Friends and Family: More than three times a week    Frequency of Social Gatherings with Friends and Family: More than three times a week    Attends Religious Services: More than 4 times per year    Active Member of [employer] or Organizations: Yes    Attends Banker Meetings: 1 to 4 times per year    Marital Status: Divorced    Tobacco Counseling Counseling given: Not Answered Tobacco comments: quit in the 70's    Clinical Intake:  Pre-visit preparation completed: No  Pain : No/denies pain     Nutritional Risks: None Diabetes: No  How often do you need to have someone help you when you read instructions, pamphlets, or other written materials from your doctor or pharmacy?: 1 - Never What is the last grade level you completed in school?: some college  Diabetic?no  Interpreter Needed?: No      Activities of Daily Living    02/15/2023    8:16 AM  In your present state of health, do you have any difficulty performing the following activities:  Hearing? 0  Vision? 0  Difficulty concentrating or making decisions? 0  Walking or climbing stairs? 0  Dressing or  bathing? 0  Doing errands, shopping? 0  Preparing Food and eating ? N  Using the Toilet? N  In the past six months, have you accidently leaked urine? N  Do you have problems with loss of bowel control? N  Managing your Medications? N  Managing your Finances? N  Housekeeping  or managing your Housekeeping? N    Patient Care Team: Natalia Leatherwood, DO as PCP - General (Family Medicine) Jodelle Red, MD as PCP - Cardiology (Cardiology) Theresia Majors, MD (Inactive) as Consulting Physician (Obstetrics and Gynecology) Christia Reading, MD as Consulting Physician (Otolaryngology) Tressia Danas, MD as Consulting Physician (Gastroenterology) Janalyn Harder, MD (Inactive) as Consulting Physician (Dermatology)  Indicate any recent Medical Services you may have received from other than [facility] providers in the past year (date may be approximate).     Assessment:   This is a routine wellness examination for Rever.  Hearing/Vision screen No results found.  Dietary issues and exercise activities discussed: Current Exercise Habits: Home exercise routine, Type of exercise: walking, Time (Minutes): 60, Frequency (Times/Week): 7, Weekly Exercise (Minutes/Week): 420   Goals Addressed               This Visit's Progress     Patient Stated (pt-stated)        Keep moving (walking)      Depression Screen    02/15/2023    8:27 AM 02/09/2022    1:53 PM 02/03/2021    9:15 AM 02/03/2021    8:34 AM 01/31/2020    9:04 AM 11/29/2019   10:40 AM  PHQ 2/9 Scores  PHQ - 2 Score 0 0 0 0 0 0    Fall Risk    02/15/2023    8:27 AM 02/09/2022    1:55 PM 02/03/2021    9:14 AM 02/03/2021    8:35 AM 01/31/2020    9:04 AM  Fall Risk   Falls in the past year? 0 0 0 0 0  Number falls in past yr: 0 0 0 0   Injury with Fall? 0 0 0 0   Risk for fall due to :  Impaired vision     Follow up Falls evaluation completed Falls prevention discussed Falls prevention discussed  Falls evaluation  completed;Education provided;Falls prevention discussed    FALL RISK PREVENTION PERTAINING TO THE HOME:  Any stairs in or around the home? Yes  If so, are there any without handrails? No  Home free of loose throw rugs in walkways, pet beds, electrical cords, etc? Yes  Adequate lighting in your home to reduce risk of falls? Yes   ASSISTIVE DEVICES UTILIZED TO PREVENT FALLS:  Life alert? No  Use of a cane, walker or w/c? No  Grab bars in the bathroom? No  Shower chair or bench in shower? No  Elevated toilet seat or a handicapped toilet? No   TIMED UP AND GO:  Was the test performed? Yes .  Length of time to ambulate 10 feet: 5 sec.   Gait steady and fast without use of assistive device  Cognitive Function:        02/15/2023    8:09 AM 02/09/2022    1:57 PM  6CIT Screen  What Year? 0 points 0 points  What month? 0 points 0 points  What time? 0 points 0 points  Count back from 20 0 points 0 points  Months in reverse 0 points 0 points  Repeat phrase 0 points 0 points  Total Score 0 points 0 points    Immunizations Immunization History  Administered Date(s) Administered   Influenza,inj,Quad PF,6+ Mos 08/08/2019   Influenza-Unspecified 09/05/2020, 09/04/2021   Janssen (J&J) SARS-COV-2 Vaccination 01/20/2020, 11/14/2020   Td 07/31/2017   Td (Adult),5 Lf Tetanus Toxid, Preservative Free 07/31/2017   Tdap 08/07/2017    TDAP status: Up  to date  Flu Vaccine status: Up to date  Pneumococcal vaccine status: Declined,  Education has been provided regarding the importance of this vaccine but patient still declined. Advised may receive this vaccine at local pharmacy or Health Dept. Aware to provide a copy of the vaccination record if obtained from local pharmacy or Health Dept. Verbalized acceptance and understanding.   Covid-19 vaccine status: Completed vaccines  Qualifies for Shingles Vaccine? No   Zostavax completed No   Shingrix Completed?: No.    Education has been  provided regarding the importance of this vaccine. Patient has been advised to call insurance company to determine out of pocket expense if they have not yet received this vaccine. Advised may also receive vaccine at local pharmacy or Health Dept. Verbalized acceptance and understanding.  Screening Tests Health Maintenance  Topic Date Due   COVID-19 Vaccine (3 - 2023-24 season) 03/03/2023 (Originally 07/08/2022)   Zoster Vaccines- Shingrix (1 of 2) 05/17/2023 (Originally 12/20/1974)   Pneumonia Vaccine 110+ Years old (1 of 1 - PCV) 02/15/2024 (Originally 12/20/2018)   INFLUENZA VACCINE  06/08/2023   Medicare Annual Wellness (AWV)  02/15/2024   MAMMOGRAM  03/08/2024   COLONOSCOPY (Pts 45-5yrs Insurance coverage will need to be confirmed)  12/17/2024   DTaP/Tdap/Td (4 - Td or Tdap) 08/08/2027   DEXA SCAN  Completed   Hepatitis C Screening  Completed   HPV VACCINES  Aged Out    Health Maintenance  There are no preventive care reminders to display for this patient.   Colorectal cancer screening: Type of screening: Colonoscopy. Completed 12/18/2019. Repeat every 5 years  Mammogram status: Completed 03/08/2022. Repeat every year  Bone Density status: Completed 01/28/2020. Results reflect: Bone density results: NORMAL. Repeat every 2 years.  Lung Cancer Screening: (Low Dose CT Chest recommended if Age 85-80 years, 30 pack-year currently smoking OR have quit w/in 15years.) does not qualify.   Lung Cancer Screening Referral: n/a  Additional Screening:  Hepatitis C Screening: does qualify; Completed 01/31/2020  Vision Screening: Recommended annual ophthalmology exams for early detection of glaucoma and other disorders of the eye. Is the patient up to date with their annual eye exam?  No  Who is the provider or what is the name of the office in which the patient attends annual eye exams? N/a If pt is not established with a provider, would they like to be referred to a provider to establish  care? No .   Dental Screening: Recommended annual dental exams for proper oral hygiene  Community Resource Referral / Chronic Care Management: CRR required this visit?  No   CCM required this visit?  No      Plan:     I have personally reviewed and noted the following in the patient's chart:   Medical and social history Use of alcohol, tobacco or illicit drugs  Current medications and supplements including opioid prescriptions. Patient is not currently taking opioid prescriptions. Functional ability and status Nutritional status Physical activity Advanced directives List of other physicians Hospitalizations, surgeries, and ER visits in previous 12 months Vitals Screenings to include cognitive, depression, and falls Referrals and appointments  In addition, I have reviewed and discussed with patient certain preventive protocols, quality metrics, and best practice recommendations. A written personalized care plan for preventive services as well as general preventive health recommendations were provided to patient.     Felix Pacini, DO   02/15/2023   Nurse Notes: Non-Face to Face or Face to Face 20 minute visit Encounter  Ms. Loni BeckwithHolstine , Thank you for taking time to come for your Medicare Wellness Visit. I appreciate your ongoing commitment to your health goals. Please review the following plan we discussed and let me know if I can assist you in the future.   These are the goals we discussed:  Goals       Patient Stated      Eat healthy, increase activity, continue drinking water & lose 10 pounds      Patient Stated      Lose weight       Patient Stated (pt-stated)      Keep moving (walking)        This is a list of the screening recommended for you and due dates:  Health Maintenance  Topic Date Due   COVID-19 Vaccine (3 - 2023-24 season) 03/03/2023*   Zoster (Shingles) Vaccine (1 of 2) 05/17/2023*   Pneumonia Vaccine (1 of 1 - PCV) 02/15/2024*   Flu Shot   06/08/2023   Medicare Annual Wellness Visit  02/15/2024   Mammogram  03/08/2024   Colon Cancer Screening  12/17/2024   DTaP/Tdap/Td vaccine (4 - Td or Tdap) 08/08/2027   DEXA scan (bone density measurement)  Completed   Hepatitis C Screening: USPSTF Recommendation to screen - Ages 7418-79 yo.  Completed   HPV Vaccine  Aged Out  *Topic was postponed. The date shown is not the original due date.

## 2023-02-15 NOTE — Patient Instructions (Signed)

## 2023-02-16 ENCOUNTER — Other Ambulatory Visit: Payer: Self-pay | Admitting: Family Medicine

## 2023-04-06 ENCOUNTER — Ambulatory Visit
Admission: RE | Admit: 2023-04-06 | Discharge: 2023-04-06 | Disposition: A | Discharge status: Home / Self Care | Payer: Medicare Other | Source: Ambulatory Visit | Attending: Family Medicine | Admitting: Family Medicine

## 2023-04-06 DIAGNOSIS — Z1231 Encounter for screening mammogram for malignant neoplasm of breast: Secondary | ICD-10-CM

## 2023-09-15 ENCOUNTER — Other Ambulatory Visit (HOSPITAL_BASED_OUTPATIENT_CLINIC_OR_DEPARTMENT_OTHER): Payer: Self-pay | Admitting: Cardiology

## 2023-09-15 DIAGNOSIS — E7800 Pure hypercholesterolemia, unspecified: Secondary | ICD-10-CM

## 2023-09-15 DIAGNOSIS — I6523 Occlusion and stenosis of bilateral carotid arteries: Secondary | ICD-10-CM

## 2023-09-25 ENCOUNTER — Encounter (HOSPITAL_BASED_OUTPATIENT_CLINIC_OR_DEPARTMENT_OTHER): Payer: Self-pay | Admitting: Cardiology

## 2023-09-25 ENCOUNTER — Ambulatory Visit (HOSPITAL_BASED_OUTPATIENT_CLINIC_OR_DEPARTMENT_OTHER): Payer: Medicare Other | Admitting: Cardiology

## 2023-09-25 VITALS — BP 132/74 | HR 55 | Ht 66.0 in | Wt 179.2 lb

## 2023-09-25 DIAGNOSIS — E7800 Pure hypercholesterolemia, unspecified: Secondary | ICD-10-CM | POA: Diagnosis not present

## 2023-09-25 DIAGNOSIS — I6523 Occlusion and stenosis of bilateral carotid arteries: Secondary | ICD-10-CM | POA: Diagnosis not present

## 2023-09-25 DIAGNOSIS — Z7189 Other specified counseling: Secondary | ICD-10-CM | POA: Diagnosis not present

## 2023-09-25 DIAGNOSIS — I700 Atherosclerosis of aorta: Secondary | ICD-10-CM

## 2023-09-25 DIAGNOSIS — I7121 Aneurysm of the ascending aorta, without rupture: Secondary | ICD-10-CM

## 2023-09-25 DIAGNOSIS — I77810 Thoracic aortic ectasia: Secondary | ICD-10-CM

## 2023-09-25 NOTE — Progress Notes (Signed)
Cardiology Office Note:  .   Date:  09/25/2023  ID:  Connie Flores, DOB 12-11-1955, MRN [id number] PCP: Natalia Leatherwood, DO  [facility] Providers Cardiologist:  Jodelle Red, MD {  History of Present Illness: .   Connie Flores is a 68 y.o. female with a hx of aortic atherosclerosis, mild dilation of ascending aorta, bilateral carotid disease who is seen for follow up today. I initially met her 08/22/22 as a new consult at the request of Kuneff, Renee A, DO for the evaluation and management of mixed hyperlipidemia, left carotid bruit, vertigo.   CV history: In 2010, admitted to [facility], [place]. Had lexiscan myoview and ultrasounds of the legs after chest pain, told her heart was fine. CT scan 08/22/22 revealed a coronary calcium score of 0. Mildly dilated caliber of ascending aorta (measures approximately 40 mm on this noncontrast study). Aortic atherosclerosis noted. Her carotid studies 09/06/22 showed moderate disease of both the left and right carotid arteries (40-59% stenosis).   Today: Woke up about 10 days ago with her heart pounding, pulse was about 70 bpm and regular. Lasted about an hour. No chest pain or shortness of breath at the time. Happened before several months prior. Reviewed history, no clear triggers. Went away on its own, and she went back to sleep. She does know that she was dreaming when she woke up, but can't remember the dream.  Generally tolerating the rosuvastatin now.   ROS: Denies chest pain, shortness of breath at rest or with normal exertion. No PND, orthopnea, LE edema or unexpected weight gain. No syncope or palpitations. ROS otherwise negative except as noted.   Studies Reviewed: Marland Kitchen    EKG:  EKG Interpretation Date/Time:  Monday September 25 2023 13:34:51 EST Ventricular Rate:  55 PR Interval:  166 QRS Duration:  78 QT Interval:  434 QTC Calculation: 415 R Axis:   12  Text Interpretation: Sinus bradycardia Low voltage  QRS Septal infarct , age undetermined Confirmed by Jodelle Red [phone]) on 09/25/2023 1:41:02 PM    Physical Exam:   VS:  BP 132/74 (BP Location: Left Arm, Patient Position: Sitting, Cuff Size: Normal)   Pulse (!) 55   Ht 5\' 6"  (1.676 m)   Wt 179 lb 3.2 oz (81.3 kg)   SpO2 98%   BMI 28.92 kg/m    Wt Readings from Last 3 Encounters:  09/25/23 179 lb 3.2 oz (81.3 kg)  02/15/23 181 lb 12.8 oz (82.5 kg)  09/22/22 184 lb (83.5 kg)    GEN: Well nourished, well developed in no acute distress HEENT: Normal, moist mucous membranes NECK: No JVD CARDIAC: regular rhythm, normal S1 and S2, no rubs or gallops. No murmur. VASCULAR: Radial and DP pulses 2+ bilaterally. No carotid bruits RESPIRATORY:  Clear to auscultation without rales, wheezing or rhonchi  ABDOMEN: Soft, non-tender, non-distended MUSCULOSKELETAL:  Ambulates independently SKIN: Warm and dry, no edema NEUROLOGIC:  Alert and oriented x 3. No focal neuro deficits noted. PSYCHIATRIC:  Normal affect    ASSESSMENT AND PLAN: .    Chest pain, atypical Bilateral carotid artery stenosis, nonobstructive Hypercholesterolemia Aortic atherosclerosis -calcium score of 0, which is reassuring -she does have nonobstructive bilateral carotid stenosis (40-59%) and aortic atherosclerosis -we have discussed the discrepancy in guidelines based on this; a calcium score of 0 would suggest against a statin, but carotid stenosis and aortic atherosclerosis suggest she would benefit from a statin -tolerating low dose rosuvastatin. LDL goal <70, most recent 725 (  peak was 179 off statin). Discussed options today, including increasing statin dose, adding zetia, changing/adding bempedoic acid. She does not like medications. After shared decision making, she is going to focus hard on lifestyle change and we will recheck lipids in 6 months. -due for monitoring of carotid stenosis, ordered today  Ascending aortic dilation -will get echocardiogram to  evaluate  CV risk counseling and prevention -recommend heart healthy/Mediterranean diet, with whole grains, fruits, vegetable, fish, lean meats, nuts, and olive oil. Limit salt. -recommend moderate walking, 3-5 times/week for 30-50 minutes each session. Aim for at least 150 minutes.week. Goal should be pace of 3 miles/hours, or walking 1.5 miles in 30 minutes -recommend avoidance of tobacco products. Avoid excess alcohol.  Dispo: 1 year or sooner as needed  Signed, Jodelle Red, MD   Jodelle Red, MD, PhD, [facility]  [facility]  [place]  [facility] at [facility] [address], [address] [place], [place] [address] [phone]

## 2023-09-25 NOTE — Patient Instructions (Signed)
Medication Instructions:  Your physician recommends that you continue on your current medications as directed. Please refer to the Current Medication list given to you today.   *If you need a refill on your cardiac medications before your next appointment, please call your pharmacy*  Lab Work: FASTING LP IN 6 MONTHS   If you have labs (blood work) drawn today and your tests are completely normal, you will receive your results only by: MyChart Message (if you have MyChart) OR A paper copy in the mail If you have any lab test that is abnormal or we need to change your treatment, we will call you to review the results.  Testing/Procedures: Your physician has requested that you have an echocardiogram. Echocardiography is a painless test that uses sound waves to create images of your heart. It provides your doctor with information about the size and shape of your heart and how well your heart's chambers and valves are working. This procedure takes approximately one hour. There are no restrictions for this procedure. Please do NOT wear cologne, perfume, aftershave, or lotions (deodorant is allowed). Please arrive 15 minutes prior to your appointment time.  Please note: We ask at that you not bring children with you during ultrasound (echo/ vascular) testing. Due to room size and safety concerns, children are not allowed in the ultrasound rooms during exams. Our front office staff cannot provide observation of children in our lobby area while testing is being conducted. An adult accompanying a patient to their appointment will only be allowed in the ultrasound room at the discretion of the ultrasound technician under special circumstances. We apologize for any inconvenience.  Your physician has requested that you have a carotid duplex. This test is an ultrasound of the carotid arteries in your neck. It looks at blood flow through these arteries that supply the brain with blood. Allow one hour for this  exam. There are no restrictions or special instructions.  Follow-Up: At [facility], you and your health needs are our priority.  As part of our continuing mission to provide you with exceptional heart care, we have created designated Provider Care Teams.  These Care Teams include your primary Cardiologist (physician) and Advanced Practice Providers (APPs -  Physician Assistants and Nurse Practitioners) who all work together to provide you with the care you need, when you need it.  We recommend signing up for the patient portal called "MyChart".  Sign up information is provided on this After Visit Summary.  MyChart is used to connect with patients for Virtual Visits (Telemedicine).  Patients are able to view lab/test results, encounter notes, upcoming appointments, etc.  Non-urgent messages can be sent to your provider as well.   To learn more about what you can do with MyChart, go to [email or web address].    Your next appointment:   12 month(s)  Provider:   DR Colvin Caroli NP

## 2023-10-18 ENCOUNTER — Ambulatory Visit (HOSPITAL_BASED_OUTPATIENT_CLINIC_OR_DEPARTMENT_OTHER): Payer: Medicare Other

## 2023-10-18 ENCOUNTER — Other Ambulatory Visit (HOSPITAL_BASED_OUTPATIENT_CLINIC_OR_DEPARTMENT_OTHER): Payer: Medicare Other

## 2023-10-18 DIAGNOSIS — I77810 Thoracic aortic ectasia: Secondary | ICD-10-CM

## 2023-10-18 DIAGNOSIS — I7121 Aneurysm of the ascending aorta, without rupture: Secondary | ICD-10-CM | POA: Diagnosis not present

## 2023-10-18 DIAGNOSIS — I6523 Occlusion and stenosis of bilateral carotid arteries: Secondary | ICD-10-CM

## 2023-10-18 LAB — ECHOCARDIOGRAM COMPLETE
Area-P 1/2: 3.53 cm2
S' Lateral: 2.53 cm

## 2023-11-27 ENCOUNTER — Encounter (HOSPITAL_BASED_OUTPATIENT_CLINIC_OR_DEPARTMENT_OTHER): Payer: Self-pay

## 2023-11-28 ENCOUNTER — Other Ambulatory Visit: Payer: Self-pay | Admitting: *Deleted

## 2023-11-28 ENCOUNTER — Other Ambulatory Visit (HOSPITAL_BASED_OUTPATIENT_CLINIC_OR_DEPARTMENT_OTHER): Payer: Self-pay | Admitting: *Deleted

## 2023-11-28 DIAGNOSIS — I6523 Occlusion and stenosis of bilateral carotid arteries: Secondary | ICD-10-CM

## 2023-11-29 ENCOUNTER — Telehealth (HOSPITAL_BASED_OUTPATIENT_CLINIC_OR_DEPARTMENT_OTHER): Payer: Self-pay

## 2023-11-29 NOTE — Telephone Encounter (Signed)
-----   Message from Alver Sorrow sent at 11/27/2023  8:04 PM EST ----- Right carotid artery with 40-59% stenosis. Left carotid artery with 60-79% stenosis which was progressed from prior. Recommend repeat carotid duplex 04/2024 to ensure no further progression. Continue Rosuvastatin to prevent progression.

## 2023-11-29 NOTE — Telephone Encounter (Signed)
Called and left VM of results released on MyChart.  ----- Message from Alver Sorrow sent at 11/27/2023  8:04 PM EST ----- Right carotid artery with 40-59% stenosis. Left carotid artery with 60-79% stenosis which was progressed from prior. Recommend repeat carotid duplex 04/2024 to ensure no further progression. Continue Rosuvastatin to prevent progression.

## 2023-12-01 ENCOUNTER — Encounter: Payer: Self-pay | Admitting: *Deleted

## 2023-12-01 ENCOUNTER — Other Ambulatory Visit: Payer: Self-pay | Admitting: *Deleted

## 2023-12-01 DIAGNOSIS — I6523 Occlusion and stenosis of bilateral carotid arteries: Secondary | ICD-10-CM

## 2024-02-28 ENCOUNTER — Encounter (HOSPITAL_BASED_OUTPATIENT_CLINIC_OR_DEPARTMENT_OTHER): Payer: Self-pay

## 2024-02-29 ENCOUNTER — Encounter: Payer: Medicare Other | Admitting: Family Medicine

## 2024-03-01 LAB — LIPID PANEL
Chol/HDL Ratio: 2.3 ratio (ref 0.0–4.4)
Cholesterol, Total: 165 mg/dL (ref 100–199)
HDL: 73 mg/dL (ref >39)
LDL Chol Calc (NIH): 81 mg/dL (ref 0–99)
Triglycerides: 54 mg/dL (ref 0–149)
VLDL Cholesterol Cal: 11 mg/dL (ref 5–40)

## 2024-03-14 ENCOUNTER — Encounter (HOSPITAL_BASED_OUTPATIENT_CLINIC_OR_DEPARTMENT_OTHER): Payer: Self-pay

## 2024-04-25 ENCOUNTER — Encounter (HOSPITAL_BASED_OUTPATIENT_CLINIC_OR_DEPARTMENT_OTHER): Payer: Self-pay | Admitting: Emergency Medicine

## 2024-04-25 ENCOUNTER — Ambulatory Visit
Admission: EM | Admit: 2024-04-25 | Discharge: 2024-04-25 | Disposition: A | Discharge status: Other Acute Inpatient Hospital | Attending: Emergency Medicine | Admitting: Emergency Medicine

## 2024-04-25 ENCOUNTER — Emergency Department (HOSPITAL_BASED_OUTPATIENT_CLINIC_OR_DEPARTMENT_OTHER)
Admission: EM | Admit: 2024-04-25 | Discharge: 2024-04-25 | Disposition: A | Discharge status: Home / Self Care | Attending: Emergency Medicine | Admitting: Emergency Medicine

## 2024-04-25 ENCOUNTER — Other Ambulatory Visit: Payer: Self-pay

## 2024-04-25 ENCOUNTER — Emergency Department (HOSPITAL_BASED_OUTPATIENT_CLINIC_OR_DEPARTMENT_OTHER): Admitting: Radiology

## 2024-04-25 DIAGNOSIS — R091 Pleurisy: Secondary | ICD-10-CM | POA: Diagnosis not present

## 2024-04-25 DIAGNOSIS — R079 Chest pain, unspecified: Secondary | ICD-10-CM

## 2024-04-25 DIAGNOSIS — I2510 Atherosclerotic heart disease of native coronary artery without angina pectoris: Secondary | ICD-10-CM | POA: Insufficient documentation

## 2024-04-25 DIAGNOSIS — R9431 Abnormal electrocardiogram [ECG] [EKG]: Secondary | ICD-10-CM

## 2024-04-25 DIAGNOSIS — Z87891 Personal history of nicotine dependence: Secondary | ICD-10-CM | POA: Diagnosis not present

## 2024-04-25 DIAGNOSIS — R072 Precordial pain: Secondary | ICD-10-CM | POA: Diagnosis present

## 2024-04-25 LAB — COMPREHENSIVE METABOLIC PANEL WITH GFR
ALT: 22 U/L (ref 0–44)
AST: 28 U/L (ref 15–41)
Albumin: 4.9 g/dL (ref 3.5–5.0)
Alkaline Phosphatase: 58 U/L (ref 38–126)
Anion gap: 12 (ref 5–15)
BUN: 10 mg/dL (ref 8–23)
CO2: 25 mmol/L (ref 22–32)
Calcium: 10.2 mg/dL (ref 8.9–10.3)
Chloride: 103 mmol/L (ref 98–111)
Creatinine, Ser: 0.76 mg/dL (ref 0.44–1.00)
GFR, Estimated: >60 mL/min (ref >60)
Glucose, Bld: 106 mg/dL — ABNORMAL HIGH (ref 70–99)
Potassium: 3.7 mmol/L (ref 3.5–5.1)
Sodium: 139 mmol/L (ref 135–145)
Total Bilirubin: 0.6 mg/dL (ref 0.0–1.2)
Total Protein: 7.7 g/dL (ref 6.5–8.1)

## 2024-04-25 LAB — CBC WITH DIFFERENTIAL/PLATELET
Abs Immature Granulocytes: 0.01 10*3/uL (ref 0.00–0.07)
Basophils Absolute: 0 10*3/uL (ref 0.0–0.1)
Basophils Relative: 1 %
Eosinophils Absolute: 0 10*3/uL (ref 0.0–0.5)
Eosinophils Relative: 1 %
HCT: 38.8 % (ref 36.0–46.0)
Hemoglobin: 13.3 g/dL (ref 12.0–15.0)
Immature Granulocytes: 0 %
Lymphocytes Relative: 39 %
Lymphs Abs: 1.6 10*3/uL (ref 0.7–4.0)
MCH: 31.4 pg (ref 26.0–34.0)
MCHC: 34.3 g/dL (ref 30.0–36.0)
MCV: 91.5 fL (ref 80.0–100.0)
Monocytes Absolute: 0.4 10*3/uL (ref 0.1–1.0)
Monocytes Relative: 10 %
Neutro Abs: 2.1 10*3/uL (ref 1.7–7.7)
Neutrophils Relative %: 49 %
Platelets: 246 10*3/uL (ref 150–400)
RBC: 4.24 MIL/uL (ref 3.87–5.11)
RDW: 13.9 % (ref 11.5–15.5)
WBC: 4.2 10*3/uL (ref 4.0–10.5)
nRBC: 0 % (ref 0.0–0.2)

## 2024-04-25 LAB — TROPONIN T, HIGH SENSITIVITY
Troponin T High Sensitivity: <15 ng/L (ref <19)
Troponin T High Sensitivity: <15 ng/L (ref <19)

## 2024-04-25 LAB — D-DIMER, QUANTITATIVE: D-Dimer, Quant: <0.27 ug{FEU}/mL (ref 0.00–0.50)

## 2024-04-25 MED ORDER — CYCLOBENZAPRINE HCL 5 MG PO TABS: 5.0000 mg | ORAL_TABLET | Freq: Once | ORAL | Status: DC

## 2024-04-25 MED ORDER — KETOROLAC TROMETHAMINE 15 MG/ML IJ SOLN: 15.0000 mg | Freq: Once | INTRAMUSCULAR | Status: DC

## 2024-04-25 NOTE — ED Triage Notes (Signed)
 Pt c/o int left sided chest pain that radiates to left upper back started at 0500 this morning. Pt denies N/V, SOB

## 2024-04-25 NOTE — ED Provider Notes (Signed)
 [place] [facility] DE[facility]MENT AT [facility] Provider Note  CSN: [id number] Arrival date & time: 04/25/24 1120  Chief Complaint(s) Chest Pain  HPI Connie Flores is a 68 y.o. female with PMHx CAD, HLD presenting with left-sided chest pain radiating to her back.  Reports that began this morning when she woke up and notes it is more of a pressure and discomfort versus pain.  Reports it is in the midsternal area and then worsens with movement and deep breathing.  Reports it does not hurt when she presses on her chest.  Denies shortness of breath.  Denies abdominal pain.  Denies headache, N/V diarrhea.  Reports she has had similar symptoms intermittently, only 1-2 times per month.  No clear trigger when they happen, does not occur with exertion or food trigger.  Denies history of blood clot or recent prolonged travel.  Seen by cardiology outpatient, last visit 09/2023 and noted to have atypical chest pain at that time but had calciums were of 0 and nonobstructive disease.  Past Medical History Past Medical History:  Diagnosis Date   Allergy    CAD (coronary artery disease) 11/08/2019   Chicken pox    Heart murmur    Shingles    Patient Active Problem List   Diagnosis Date Noted   Vertigo 05/26/2020   Statin declined 02/03/2020   Carotid artery stenosis, asymptomatic, left 02/03/2020   Overweight (BMI 25.0-29.9) 01/31/2020   Hyperlipidemia 01/23/2020   Pulsatile tinnitus of left ear 11/29/2019   Family history of ovarian cancer 11/29/2019   Family history of prostate cancer 11/29/2019   Colon cancer screening 11/29/2019   Home Medication(s) Prior to Admission medications   Medication Sig Start Date End Date Taking? Authorizing Provider  Ascorbic Acid (VITAMIN C) POWD Take by mouth daily.    [provider]  b complex vitamins tablet Take 1 tablet by mouth daily.    [provider]  Black Elderberry,Berry-Flower, 575 MG CAPS Take 1 capsule by mouth  daily.    [provider]  Coenzyme Q10 (COQ10) 100 MG CAPS Take by mouth. Daily    [provider]  LYSINE PO Take by mouth. PRN for cold sores    [provider]  rosuvastatin  (CRESTOR ) 5 MG tablet TAKE 1 TABLET (5 MG TOTAL) BY MOUTH DAILY. 09/15/23 09/09/24  Sheryle Donning, MD  VITAMIN D PO Take by mouth.    [provider]                                                                                                                                    Past Surgical History Past Surgical History:  Procedure Laterality Date   WISDOM TOOTH EXTRACTION     Family History Family History  Problem Relation Age of Onset   Ovarian cancer Sister        died 2020-03-27   Miscarriages / [place] Sister    Colon  polyps Sister    Diabetes Brother    Prostate cancer Brother    Hyperlipidemia Brother    Drug abuse Daughter    Mental illness Daughter    Prostate cancer Brother    Colon polyps Brother    Hyperlipidemia Brother    Prostate cancer Brother    Colon cancer Neg Hx    Esophageal cancer Neg Hx    Rectal cancer Neg Hx    Stomach cancer Neg Hx     Social History Social History   Tobacco Use   Smoking status: Former    Current packs/day: 1.00    Average packs/day: 1 pack/day for 3.0 years (3.0 ttl pk-yrs)    Types: Cigarettes   Smokeless tobacco: Never   Tobacco comments:    quit in the 70's   Vaping Use   Vaping status: Never Used  Substance Use Topics   Alcohol use: Yes    Alcohol/week: 4.0 standard drinks of alcohol    Types: 4 Glasses of wine per week   Drug use: Never   Allergies Patient has no known allergies.  Review of Systems A thorough review of systems was obtained and all systems are negative except as noted in the HPI and PMH.   Physical Exam Vital Signs  I have reviewed the triage vital signs BP (!) 165/78 (BP Location: Right Arm)   Pulse 63   Temp 98.5 F (36.9 C) (Oral)   Resp 18   SpO2 98%  Physical  Exam  General: Well-appearing, alert, NAD Eyes: PERRLA, anicteric sclera ENTM: Moist mucus membranes. Neck: Supple, non-tender Cardiovascular: RRR without murmur.  Nontender to palpation over mid sternal region. Respiratory: CTAB. Normal WOB on RA Gastrointestinal: Soft, non-tender, non-distended MSK: No peripheral edema Derm: Warm, dry, no rashes noted Neuro: CN intact. Motor and sensation intact globally Psych: Cooperative, pleasant   ED Results and Treatments Labs (all labs ordered are listed, but only abnormal results are displayed) Labs Reviewed  COMPREHENSIVE METABOLIC PANEL WITH GFR - Abnormal; Notable for the following components:      Result Value   Glucose, Bld 106 (*)    All other components within normal limits  CBC WITH DIFFERENTIAL/PLATELET  D-DIMER, QUANTITATIVE  TROPONIN T, HIGH SENSITIVITY  TROPONIN T, HIGH SENSITIVITY                                                                                                                          Radiology DG Chest 2 View Result Date: 04/25/2024 CLINICAL DATA:  cp EXAM: CHEST - 2 VIEW COMPARISON:  August 30, 2022 FINDINGS: No focal airspace consolidation, pleural effusion, or pneumothorax. No cardiomegaly. Tortuous aorta with aortic atherosclerosis. No acute fracture or destructive lesion. Multilevel degenerative disc disease of the spine. IMPRESSION: No acute cardiopulmonary abnormality. Electronically Signed   By: Rance Burrows M.D.   On: 04/25/2024 13:35    Pertinent labs & imaging results that were available during my care of the patient were reviewed by  me and considered in my medical decision making (see MDM for details).  Medications Ordered in ED Medications - No data to display                                                                  Medical Decision Making / ED Course    Medical Decision Making:    Connie Flores is a 68 y.o. female with PMHx CAD, HLD presenting with left-sided chest pain  radiating to her back. The complaint involves an extensive differential diagnosis and also carries with it a high risk of complications and morbidity.  Serious etiology was considered. Ddx includes but is not limited to: ACS, unstable angina, PE, GERD, costochondritis, dissection  Complete initial physical exam performed, notably the patient was in no distress.    Reviewed and confirmed nursing documentation for past medical history, family history, social history.  Vital signs reviewed.   Brief summary:  68 year old female with history as above presenting with left-sided chest pressure radiating to her back began this morning but has been occurring intermittently.  EKG reassuring.  Atypical presentation giving worse with movement and with deep breathing but not reproducible upon exam.  Given component of pleuritic chest pain, obtained D-dimer but no other risk factors for PE.  D-dimer negative.  Lab work otherwise reassuring.  CXR pending.  Possible atypical cardiac picture but recent evaluation reassuring against coronary disease.  Not reproducible upon exam given intermittent symptoms, less likely MSK.   Clinical Course as of 04/25/24 1513  Thu Apr 25, 2024  1505 Troponin negative x2. [KH]  1512 Mostly likely MSK in origin given some reproducibility upon reassessment.  Recommend outpatient PCP and cardiology follow-up for continued care.  Patient declined any medication management for symptoms. [KH]    Clinical Course User Index [KH] Jonne Netters, MD     Additional history obtained: -External records from outside source obtained and reviewed including: Chart review including previous notes, labs, imaging, consultation notes including: OP cardiology notes   Lab Tests: -I ordered, reviewed, and interpreted labs.   The pertinent results include:   Labs Reviewed  COMPREHENSIVE METABOLIC PANEL WITH GFR - Abnormal; Notable for the following components:      Result Value   Glucose, Bld  106 (*)    All other components within normal limits  CBC WITH DIFFERENTIAL/PLATELET  D-DIMER, QUANTITATIVE  TROPONIN T, HIGH SENSITIVITY  TROPONIN T, HIGH SENSITIVITY    EKG   EKG Interpretation Date/Time:  Thursday April 25 2024 11:31:51 EDT Ventricular Rate:  60 PR Interval:  157 QRS Duration:  97 QT Interval:  453 QTC Calculation: 453 R Axis:   30  Text Interpretation: Sinus rhythm Low voltage, precordial leads Borderline T abnormalities, anterior leads Confirmed by Russella Courts ([id number]) on 04/25/2024 11:56:07 AM         Imaging Studies ordered: I ordered imaging studies including: DG Chest 2 View Result Date: 04/25/2024 CLINICAL DATA:  cp EXAM: CHEST - 2 VIEW COMPARISON:  August 30, 2022 FINDINGS: No focal airspace consolidation, pleural effusion, or pneumothorax. No cardiomegaly. Tortuous aorta with aortic atherosclerosis. No acute fracture or destructive lesion. Multilevel degenerative disc disease of the spine. IMPRESSION: No acute cardiopulmonary abnormality. Electronically Signed   By: Jodelle Mungo.D.  On: 04/25/2024 13:35    I independently visualized the following imaging with scope of interpretation limited to determining acute life threatening conditions related to emergency care; findings noted above I agree with the radiologist interpretation If any imaging was obtained with contrast I closely monitored patient for any possible adverse reaction a/w contrast administration in the emergency department   Medicines ordered and prescription drug management: Meds ordered this encounter  Medications   DISCONTD: ketorolac (TORADOL) 15 MG/ML injection 15 mg   DISCONTD: cyclobenzaprine (FLEXERIL) tablet 5 mg    -I have reviewed the patients home medicines and have made adjustments as needed  Cardiac Monitoring: The patient was maintained on a cardiac monitor.  I personally viewed and interpreted the cardiac monitored which showed an underlying rhythm of:  NSR Continuous pulse oximetry interpreted by myself, 98% % on room air.    Reevaluation: After the interventions noted above, I reevaluated the patient and found that they have improved  Co morbidities that complicate the patient evaluation  Past Medical History:  Diagnosis Date   Allergy    CAD (coronary artery disease) 11/08/2019   Chicken pox    Heart murmur    Shingles       Dispostion: Disposition decision including need for hospitalization was considered, and patient discharged from emergency department.    Final Clinical Impression(s) / ED Diagnoses Final diagnoses:  Chest pain, unspecified type  Pleurisy        Jonne Netters, MD 04/25/24 1513

## 2024-04-25 NOTE — ED Notes (Signed)
 Patient is being discharged from the Urgent Care and sent to the Emergency Department via POV . Per Alayne Allis, NP, patient is in need of higher level of care due to needing higher level of care. Patient is aware and verbalizes understanding of plan of care.  Vitals:   04/25/24 1047  BP: (!) 163/90  Pulse: 62  Resp: 16  Temp: 98.9 F (37.2 C)  SpO2: 96%

## 2024-04-25 NOTE — ED Triage Notes (Signed)
 C/o left sided chest pain that radiates to left upper back started at 0500 this morning. Denies N/V, SOB. Worse with movement.

## 2024-04-25 NOTE — Discharge Instructions (Addendum)
Return to the Emergency Department if you have unusual chest pain, pressure, or discomfort, shortness of breath, nausea, vomiting, burping, heartburn, tingling upper body parts, sweating, cold, clammy skin, or racing heartbeat. Call 911 if you think you are having a heart attack. Follow cardiac diet - avoid fatty & fried foods, don't eat too much red meat, eat lots of fruits & vegetables, and dairy products should be low fat. Please lose weight if you are overweight. Become more active with walking, gardening, or any other activity that gets you to moving.   Please return to the emergency department immediately for any new or concerning symptoms, or if you get worse. 

## 2024-05-07 ENCOUNTER — Ambulatory Visit
Admission: RE | Admit: 2024-05-07 | Discharge: 2024-05-07 | Disposition: A | Discharge status: Home / Self Care | Source: Ambulatory Visit | Attending: Physician Assistant | Admitting: Physician Assistant

## 2024-05-07 ENCOUNTER — Other Ambulatory Visit: Payer: Self-pay | Admitting: Physician Assistant

## 2024-05-07 DIAGNOSIS — Z1231 Encounter for screening mammogram for malignant neoplasm of breast: Secondary | ICD-10-CM

## 2024-09-25 ENCOUNTER — Other Ambulatory Visit (HOSPITAL_BASED_OUTPATIENT_CLINIC_OR_DEPARTMENT_OTHER): Payer: Self-pay | Admitting: Cardiology

## 2024-09-25 DIAGNOSIS — I6523 Occlusion and stenosis of bilateral carotid arteries: Secondary | ICD-10-CM

## 2024-09-25 DIAGNOSIS — E7800 Pure hypercholesterolemia, unspecified: Secondary | ICD-10-CM

## 2024-09-26 ENCOUNTER — Encounter (HOSPITAL_BASED_OUTPATIENT_CLINIC_OR_DEPARTMENT_OTHER): Payer: Self-pay | Admitting: Cardiology

## 2024-09-26 ENCOUNTER — Ambulatory Visit (INDEPENDENT_AMBULATORY_CARE_PROVIDER_SITE_OTHER): Admitting: Cardiology

## 2024-09-26 VITALS — BP 126/80 | HR 55 | Ht 66.0 in | Wt 168.5 lb

## 2024-09-26 DIAGNOSIS — I700 Atherosclerosis of aorta: Secondary | ICD-10-CM

## 2024-09-26 DIAGNOSIS — E7800 Pure hypercholesterolemia, unspecified: Secondary | ICD-10-CM | POA: Diagnosis not present

## 2024-09-26 DIAGNOSIS — I6523 Occlusion and stenosis of bilateral carotid arteries: Secondary | ICD-10-CM | POA: Diagnosis not present

## 2024-09-26 DIAGNOSIS — Z712 Person consulting for explanation of examination or test findings: Secondary | ICD-10-CM

## 2024-09-26 DIAGNOSIS — Z7189 Other specified counseling: Secondary | ICD-10-CM

## 2024-09-26 MED ORDER — ROSUVASTATIN CALCIUM 10 MG PO TABS: 10.0000 mg | ORAL_TABLET | Freq: Every day | ORAL | 3 refills | Status: AC

## 2024-09-26 NOTE — Progress Notes (Signed)
 Cardiology Office Note:  .   Date:  09/26/2024  ID:  Connie Flores, DOB June 24, 1956, MRN [id number] PCP: Sheldon Netter, PA  [facility] Providers Cardiologist:  Shelda Bruckner, MD {  History of Present Illness: .   Connie Flores is a 68 y.o. female with a hx of aortic atherosclerosis, mild dilation of ascending aorta, bilateral carotid disease who is seen for follow up today. I initially met her 08/22/22 as a new consult at the request of Kuneff, Renee A, DO for the evaluation and management of mixed hyperlipidemia, left carotid bruit, vertigo.   CV history: In 2010, admitted to [facility] [place]. Had lexiscan myoview and ultrasounds of the legs after chest pain, told her heart was fine. CT scan 08/22/22 revealed a coronary calcium  score of 0. Mildly dilated caliber of ascending aorta (measures approximately 40 mm on this noncontrast study). Aortic atherosclerosis noted. Her carotid studies 09/06/22 showed moderate disease of both the left and right carotid arteries (40-59% stenosis).   Today: Overall doing well. Has some aches in her shoulders, right more than left. Has arthritis, not sure what pains are arthritis and what are from medications. Has rare times when she notices her heart beat for a bit, not irregular/fast, not bothersome.  Went to the ER in June for chest pain. Workup unremarkable. Had lifted something heavy, had tenderness in her chest, worse with movement. No issues since.  Reviewed carotid ultrasounds, symptoms to watch for. She has had no symptoms, specifically discussed amaurosis fugax.  ROS: Denies chest pain, shortness of breath at rest or with normal exertion. No PND, orthopnea, LE edema or unexpected weight gain. No syncope or palpitations. ROS otherwise negative except as noted.   Studies Reviewed: SABRA    EKG:       Physical Exam:   VS:  BP 126/80   Pulse (!) 55   Ht 5' 6 (1.676 m)   Wt 168 lb 8 oz (76.4 kg)   SpO2 97%   BMI  27.20 kg/m    Wt Readings from Last 3 Encounters:  09/26/24 168 lb 8 oz (76.4 kg)  09/25/23 179 lb 3.2 oz (81.3 kg)  02/15/23 181 lb 12.8 oz (82.5 kg)    GEN: Well nourished, well developed in no acute distress HEENT: Normal, moist mucous membranes NECK: No JVD CARDIAC: regular rhythm, normal S1 and S2, no rubs or gallops. No murmur. VASCULAR: Radial and DP pulses 2+ bilaterally. No carotid bruits RESPIRATORY:  Clear to auscultation without rales, wheezing or rhonchi  ABDOMEN: Soft, non-tender, non-distended MUSCULOSKELETAL:  Ambulates independently SKIN: Warm and dry, no edema NEUROLOGIC:  Alert and oriented x 3. No focal neuro deficits noted. PSYCHIATRIC:  Normal affect    ASSESSMENT AND PLAN: .    Chest pain, atypical Bilateral carotid artery stenosis Hypercholesterolemia Aortic atherosclerosis -calcium  score of 0 -bilateral carotid stenosis (R 40-59%, L 60-79%) and aortic atherosclerosis. We reviewed her test results today -tolerating low dose rosuvastatin  5 mg daily. LDL goal <70, most recent 81 (peak was 179 off statin). I recommended trialing 10 mg dose given that her carotids have progressed slightly. She is hesitant about this; we discussed at length, reviewed options. Discussed diet and exercise recommendations. She is willing to try 10 mg dose, sent to pharmacy today. Recheck lipids in 3 mos.  Ascending aortic dilation seen on calcium  score CT -on most recent echo, this was normal, does not need routine monitoring  CV risk counseling and prevention -recommend heart healthy/Mediterranean  diet, with whole grains, fruits, vegetable, fish, lean meats, nuts, and olive oil. Limit salt. -recommend moderate walking, 3-5 times/week for 30-50 minutes each session. Aim for at least 150 minutes.week. Goal should be pace of 3 miles/hours, or walking 1.5 miles in 30 minutes -recommend avoidance of tobacco products. Avoid excess alcohol.  Dispo: 1 year or sooner as  needed  Signed, Shelda Bruckner, MD   Shelda Bruckner, MD, PhD, [facility] [place]  [facility]  [place]  Heart & Vascular at [facility] at [facility] [address] [place], [place] [address] [phone]

## 2024-09-26 NOTE — Patient Instructions (Signed)
 Medication Instructions:  Your physician has recommended you make the following change in your medication:  1.) increase rosuvastatin  (Crestor ) to 10 mg - take one tablet daily  *If you need a refill on your cardiac medications before your next appointment, please call your pharmacy*  Lab Work: Return in 3 months for blood work (fasting lipid panel)  If you have labs (blood work) drawn today and your tests are completely normal, you will receive your results only by: MyChart Message (if you have MyChart) OR A paper copy in the mail If you have any lab test that is abnormal or we need to change your treatment, we will call you to review the results.  Testing/Procedures: none  Follow-Up: At [facility], you and your health needs are our priority.  As part of our continuing mission to provide you with exceptional heart care, our providers are all part of one team.  This team includes your primary Cardiologist (physician) and Advanced Practice Providers or APPs (Physician Assistants and Nurse Practitioners) who all work together to provide you with the care you need, when you need it.  Your next appointment:   12 month(s)  Provider:   Shelda Bruckner, MD, Rosaline Bane, NP, or Reche Finder, NP

## 2024-11-15 ENCOUNTER — Ambulatory Visit: Admitting: Nurse Practitioner

## 2024-11-15 ENCOUNTER — Encounter: Payer: Self-pay | Admitting: Nurse Practitioner

## 2024-11-15 VITALS — BP 110/68 | HR 66 | Ht 65.75 in | Wt 165.0 lb

## 2024-11-15 DIAGNOSIS — Z01419 Encounter for gynecological examination (general) (routine) without abnormal findings: Secondary | ICD-10-CM

## 2024-11-15 DIAGNOSIS — Z9289 Personal history of other medical treatment: Secondary | ICD-10-CM

## 2024-11-15 DIAGNOSIS — Z9189 Other specified personal risk factors, not elsewhere classified: Secondary | ICD-10-CM | POA: Diagnosis not present

## 2024-11-15 DIAGNOSIS — Z780 Asymptomatic menopausal state: Secondary | ICD-10-CM

## 2024-11-15 DIAGNOSIS — B009 Herpesviral infection, unspecified: Secondary | ICD-10-CM

## 2024-11-15 NOTE — Progress Notes (Signed)
" ° °  Connie Flores [facility] 1955/12/07 [id number]   History:  69 y.o. G3P3 presents for breast and pelvic exam. Postmenopausal - no HRT, no bleeding. Normal pap history. Sister deceased from ovarian cancer, paternal cousin also passed from ovarian cancer. H/O CAD, HSV.   Gynecologic History No LMP recorded. Patient is postmenopausal.   Contraception: post menopausal status Sexually active: No  Health Maintenance Last Pap: 03/15/2022. Results were: Normal neg HPV Last mammogram: 05/07/2024. Results were: Normal Last colonoscopy: 12/18/2019, 10-year recall Last Dexa: 01/28/2020. Results were: Normal  Past medical history, past surgical history, family history and social history were all reviewed and documented in the EPIC chart. Single. Retired. Jehovah's witness. Has daughter, 42 yo grandson.   ROS:  A ROS was performed and pertinent positives and negatives are included.  Exam:  Vitals:   11/15/24 1438  BP: 110/68  Pulse: 66  SpO2: 99%  Weight: 165 lb (74.8 kg)  Height: 5' 5.75 (1.67 m)   Body mass index is 26.83 kg/m. Physical Exam Constitutional:      Appearance: Normal appearance.  Neck:     Thyroid : No thyroid  mass, thyromegaly or thyroid  tenderness.  Cardiovascular:     Rate and Rhythm: Normal rate and regular rhythm.  Pulmonary:     Effort: Pulmonary effort is normal.     Breath sounds: Normal breath sounds.  Chest:  Breasts:    Right: Normal.     Left: Normal.  Abdominal:     Palpations: Abdomen is soft.     Tenderness: There is no abdominal tenderness.  Genitourinary:    General: Normal vulva.     Vagina: Normal.     Cervix: Normal.     Uterus: Normal.      Adnexa: Right adnexa normal and left adnexa normal.     Rectum: Normal.  Lymphadenopathy:     Upper Body:     Right upper body: No supraclavicular or axillary adenopathy.     Left upper body: No supraclavicular or axillary adenopathy.    Zada Louder, CMA present as chaperone.    Assessment/Plan:  69 y.o.  G3P3 for breast and pelvic exam.   Encounter for breast and pelvic examination - Education provided on SBEs, importance of preventative screenings, current guidelines, high calcium  diet, regular exercise, and multivitamin daily. Labs with PCP.   Postmenopausal - Plan: DG Bone Density. No HRT, no bleeding  Screening for cervical cancer - No longer screening per guidelines.   Screening for breast cancer - Normal mammogram history.  Continue annual screenings.  Normal breast exam today.  Screening for colon cancer - 2021 colonoscopy. Will repeat at 10-year interval per GI's recommendation.   Return in about 1 year (around 11/15/2025) for B&P (high risk).    [name] DNP, 3:13 PM 11/15/2024 "

## 2024-11-18 ENCOUNTER — Encounter (HOSPITAL_BASED_OUTPATIENT_CLINIC_OR_DEPARTMENT_OTHER)

## 2024-11-18 DIAGNOSIS — I6523 Occlusion and stenosis of bilateral carotid arteries: Secondary | ICD-10-CM

## 2024-11-25 ENCOUNTER — Ambulatory Visit (HOSPITAL_BASED_OUTPATIENT_CLINIC_OR_DEPARTMENT_OTHER): Payer: Self-pay | Admitting: Cardiology

## 2025-11-17 ENCOUNTER — Encounter: Admitting: Nurse Practitioner
